# Patient Record
Sex: Female | Born: 1970 | Race: White | Hispanic: No | Marital: Married | Smoking: Never smoker
Health system: Southern US, Community
[De-identification: ages and names within clinical notes are randomized; demographics above are authoritative.]

## PROBLEM LIST (undated history)

## (undated) DIAGNOSIS — E785 Hyperlipidemia, unspecified: Secondary | ICD-10-CM

## (undated) DIAGNOSIS — E039 Hypothyroidism, unspecified: Secondary | ICD-10-CM

## (undated) DIAGNOSIS — G4733 Obstructive sleep apnea (adult) (pediatric): Secondary | ICD-10-CM

## (undated) DIAGNOSIS — R079 Chest pain, unspecified: Secondary | ICD-10-CM

## (undated) DIAGNOSIS — S161XXA Strain of muscle, fascia and tendon at neck level, initial encounter: Secondary | ICD-10-CM

## (undated) DIAGNOSIS — I10 Essential (primary) hypertension: Secondary | ICD-10-CM

## (undated) HISTORY — PX: BACK SURGERY: SHX140

## (undated) HISTORY — PX: OTHER SURGICAL HISTORY: SHX169

## (undated) HISTORY — DX: Essential (primary) hypertension: I10

## (undated) HISTORY — DX: Hyperlipidemia, unspecified: E78.5

## (undated) HISTORY — DX: Chest pain, unspecified: R07.9

## (undated) HISTORY — DX: Obstructive sleep apnea (adult) (pediatric): G47.33

## (undated) HISTORY — DX: Strain of muscle, fascia and tendon at neck level, initial encounter: S16.1XXA

## (undated) HISTORY — DX: Hypothyroidism, unspecified: E03.9

---

## 2021-07-04 ENCOUNTER — Encounter: Payer: Self-pay | Admitting: *Deleted

## 2021-07-05 ENCOUNTER — Ambulatory Visit (INDEPENDENT_AMBULATORY_CARE_PROVIDER_SITE_OTHER): Payer: BC Managed Care – PPO | Admitting: Internal Medicine

## 2021-07-05 ENCOUNTER — Other Ambulatory Visit: Payer: Self-pay

## 2021-07-05 ENCOUNTER — Encounter: Payer: Self-pay | Admitting: Internal Medicine

## 2021-07-05 VITALS — BP 118/70 | HR 67 | Ht 61.0 in | Wt 183.2 lb

## 2021-07-05 DIAGNOSIS — E786 Lipoprotein deficiency: Secondary | ICD-10-CM | POA: Diagnosis not present

## 2021-07-05 DIAGNOSIS — R079 Chest pain, unspecified: Secondary | ICD-10-CM | POA: Diagnosis not present

## 2021-07-05 DIAGNOSIS — E039 Hypothyroidism, unspecified: Secondary | ICD-10-CM | POA: Diagnosis not present

## 2021-07-05 MED ORDER — ROSUVASTATIN CALCIUM 40 MG PO TABS: 40.0000 mg | ORAL_TABLET | Freq: Every day | ORAL | 3 refills | Status: DC

## 2021-07-05 MED ORDER — ROSUVASTATIN CALCIUM 10 MG PO TABS: 10.0000 mg | ORAL_TABLET | Freq: Every day | ORAL | 3 refills | Status: DC

## 2021-07-05 MED ORDER — LISINOPRIL-HYDROCHLOROTHIAZIDE 10-12.5 MG PO TABS
ORAL_TABLET | ORAL | 3 refills | Status: DC
Start: 2021-07-05 — End: 2021-07-05

## 2021-07-05 NOTE — Patient Instructions (Signed)
Medication Instructions:  ?Your physician recommends that you continue on your current medications as directed. Please refer to the Current Medication list given to you today. ? ?*If you need a refill on your cardiac medications before your next appointment, please call your pharmacy* ? ? ?Lab Work: ?Your physician recommends that you return for lab work in: April  ? ?If you have labs (blood work) drawn today and your tests are completely normal, you will receive your results only by: ?MyChart Message (if you have MyChart) OR ?A paper copy in the mail ?If you have any lab test that is abnormal or we need to change your treatment, we will call you to review the results. ? ? ?Testing/Procedures: ?Calcium Score CT  ? ? ?Follow-Up: ?At [facility], you and your health needs are our priority.  As part of our continuing mission to provide you with exceptional heart care, we have created designated Provider Care Teams.  These Care Teams include your primary Cardiologist (physician) and Advanced Practice Providers (APPs -  Physician Assistants and Nurse Practitioners) who all work together to provide you with the care you need, when you need it. ? ?We recommend signing up for the patient portal called "MyChart".  Sign up information is provided on this After Visit Summary.  MyChart is used to connect with patients for Virtual Visits (Telemedicine).  Patients are able to view lab/test results, encounter notes, upcoming appointments, etc.  Non-urgent messages can be sent to your provider as well.   ?To learn more about what you can do with MyChart, go to [email or web address].   ? ?Your next appointment:   ?1 year(s) ? ?The format for your next appointment:   ?In Person ? ?Provider:   ?Dietrich Pates, MD  ? ? ?Other Instructions ?Thank you for choosing [facility]! ? ? ? ?

## 2021-07-05 NOTE — Progress Notes (Signed)
? ?Cardiology Office Note ? ? ?Date:  07/05/2021  ? ?ID:  Heidi Phelps, DOB 1971/03/12, MRN [id number] ? ?PCP:  Dion Saucier, PA-C  ?Cardiologist:   Dietrich Pates, MD  ? ? ? ?  ?History of Present Illness: ?Heidi Phelps is a 51 y.o. female with a history of hypertension and gestational diabetes.  [facility] January [facility]  At that time she complained of left-sided chest pain/soreness that started 5 days prior.  She had a viral infection (GI) in December had a lot of vomiting with that.  She started having chest pains after left side radiated to the rest of her chest hurt to move or bend forward also to take a deep breath got worse on the Friday Saturday before the visit.  She describes it actually as a pulling sensation that she had. ? ?Since that visit the episodes have nearly resolved. ? 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 ? ?The patient says she has been on medication for high blood pressure for about 15 years.  She is a mother of 2 and had gestational diabetes with both pregnancies.  Family history significant for CAD. ? ?Diet:   ?Breakfast:  Yogurt (Gr vanilla)  Fruit ?Lunch   Sandwich     ?Dinner:   Set designer and veggies ? ?Water  ? ? ?Current Meds  ?Medication Sig  ? cyclobenzaprine (FLEXERIL) 10 MG tablet Take 10 mg by mouth at bedtime.  ? levonorgestrel (MIRENA) 20 MCG/DAY IUD by Intrauterine route.  ? levothyroxine (SYNTHROID) 175 MCG tablet Take 175 mcg by mouth daily before breakfast.  ? meloxicam (MOBIC) 15 MG tablet Take 15 mg by mouth daily.  ? rosuvastatin (CRESTOR) 40 MG tablet Take 1 tablet (40 mg total) by mouth daily.  ? [DISCONTINUED] lisinopril-hydrochlorothiazide (ZESTORETIC) 10-12.5 MG tablet Take 1 tablet by mouth daily.  ? [DISCONTINUED] rosuvastatin (CRESTOR) 10 MG tablet Take 10 mg by mouth at bedtime.  ? ? ? ?Allergies:   Patient has no known allergies.  ? ?Past Medical History:  ?Diagnosis Date  ?  Chest pain   ? Hypertension   ? Hypothyroidism   ? Neck strain   ? ? ?Past Surgical History:  ?Procedure Laterality Date  ? BACK SURGERY    ? Gallbladder    ? ? ? ?Social History:  The patient  reports that she has never smoked. She has never used smokeless tobacco. She reports that she does not drink alcohol and does not use drugs.  ? ?Family History:  The patient's family history includes COPD in her mother; Diabetes in her mother; Emphysema in her mother; Hypertension in her father and mother; Kidney disease in her father; Thyroid disease in her brother.  ? ? ?ROS:  Please see the history of present illness. All other systems are reviewed and  Negative to the above problem except as noted.  ? ? ?PHYSICAL EXAM: ?VS:  BP 118/70   Pulse 67   Ht 5\' 1"  (1.549 m)   Wt 183 lb 3.2 oz (83.1 kg)   SpO2 97%   BMI 34.62 kg/m?   ?GEN: Obese 51 year old in no acute distress  ?HEENT: normal  ?Neck: no JVD, carotid bruits ?Cardiac: RRR; no murmurs, no lower extremity edema  ?Respiratory:  clear to auscultation bilaterally,  ?GI: soft, nontender, nondistended, + BS  No hepatomegaly  ?MS: no deformity Moving all extremities   ?  Skin: warm and dry, no rash ?Neuro:  Strength and sensation are intact ?Psych: euthymic mood, full affect ? ? ?EKG:  EKG is ordered today.  Normal sinus rhythm 67 bpm.  Nonspecific T wave changes ? ? ?Lipid Panel ?No results found for: CHOL, TRIG, HDL, CHOLHDL, VLDL, LDLCALC, LDLDIRECT ?  ? ?Wt Readings from Last 3 Encounters:  ?07/05/21 183 lb 3.2 oz (83.1 kg)  ?  ? ? ?ASSESSMENT AND PLAN: ?1.  Chest pain atypical I do not think it is cardiac in origin.  Appears more musculoskeletal. ? ?2 hypertension patient has been on meds for years blood pressures controlled ? ?3 hypothyroidism she is on Synthroid and her sugars are not regulated I would recommend a referral to Dr. Fransico Him confirm ? ?4 dyslipidemia.  Patient is now on Crestor.  Will check a lipomed in the future as LPa April B.   ? ?5.  Cardiac risk  factors  Patient has a family history of CAD.  She has a history of gestational diabetes.  She is hypertensive.  She is obese.  Reviewed her diet.  I would limit carbs, cut back on added sugar to less than 24 g/day.  I would also recommend referral again to Dr. Needed for lifestyle options.  She is very interested in this. ? ?She is also interested in a calcium score CT given risk factors to see if she has any plaquing already.  This is reasonable given that she is on a statin.  We will help define goals. ? ? ? ?Current medicines are reviewed at length with the patient today.  The patient does not have concerns regarding medicines. ? ?Signed, ?Dietrich Pates, MD  ?07/05/2021 9:24 AM    ?[facility] ?[address] [facility] [place], [place], [place]  [address] ?Phone: [phone]; Fax: [phone]  ? ? ?

## 2021-07-18 ENCOUNTER — Encounter: Payer: Self-pay | Admitting: Nurse Practitioner

## 2021-07-18 ENCOUNTER — Ambulatory Visit (INDEPENDENT_AMBULATORY_CARE_PROVIDER_SITE_OTHER): Payer: BC Managed Care – PPO | Admitting: Nurse Practitioner

## 2021-07-18 ENCOUNTER — Other Ambulatory Visit: Payer: Self-pay

## 2021-07-18 VITALS — BP 95/63 | HR 75 | Ht 61.0 in | Wt 179.6 lb

## 2021-07-18 DIAGNOSIS — E039 Hypothyroidism, unspecified: Secondary | ICD-10-CM | POA: Diagnosis not present

## 2021-07-18 MED ORDER — LEVOTHYROXINE SODIUM 125 MCG PO TABS: 125.0000 ug | ORAL_TABLET | Freq: Every day | ORAL | 3 refills | Status: DC

## 2021-07-18 NOTE — Progress Notes (Signed)
?                                   ?                                Endocrinology Consult Note  ?                                       07/18/2021, 2:42 PM ? ?Subjective:  ? ?Subjective   ? ?Heidi Phelps is a 51 y.o.-year-old female patient being seen in consultation for hypothyroidism referred by Dion Saucier, PA-C. ? ? ?Past Medical History:  ?Diagnosis Date  ? Chest pain   ? Hyperlipidemia   ? Hypertension   ? Hypothyroidism   ? Neck strain   ? ? ?Past Surgical History:  ?Procedure Laterality Date  ? BACK SURGERY    ? Gallbladder    ? ? ?Social History  ? ?Socioeconomic History  ? Marital status: Married  ?  Spouse name: Not on file  ? Number of children: Not on file  ? Years of education: Not on file  ? Highest education level: Not on file  ?Occupational History  ? Not on file  ?Tobacco Use  ? Smoking status: Never  ? Smokeless tobacco: Never  ?Vaping Use  ? Vaping Use: Never used  ?Substance and Sexual Activity  ? Alcohol use: Never  ? Drug use: Never  ? Sexual activity: Not on file  ?Other Topics Concern  ? Not on file  ?Social History Narrative  ? Not on file  ? ?Social Determinants of Health  ? ?Financial Resource Strain: Not on file  ?Food Insecurity: Not on file  ?Transportation Needs: Not on file  ?Physical Activity: Not on file  ?Stress: Not on file  ?Social Connections: Not on file  ? ? ?Family History  ?Problem Relation Age of Onset  ? Thyroid disease Mother   ? Hypertension Mother   ? Diabetes Mother   ? COPD Mother   ? Emphysema Mother   ? Hyperlipidemia Mother   ? Diabetes Father   ? Kidney disease Father   ? Hypertension Father   ? Thyroid disease Brother   ? ? ?Outpatient Encounter Medications as of 07/18/2021  ?Medication Sig  ? cyclobenzaprine (FLEXERIL) 10 MG tablet Take 10 mg by mouth at bedtime.  ? levonorgestrel (MIRENA) 20 MCG/DAY IUD by Intrauterine route.  ? levothyroxine (SYNTHROID) 125 MCG tablet Take 1 tablet (125 mcg total) by mouth daily.  ? lisinopril-hydrochlorothiazide  (ZESTORETIC) 20-12.5 MG tablet Take 1 tablet by mouth daily.  ? meloxicam (MOBIC) 15 MG tablet Take 15 mg by mouth daily.  ? rosuvastatin (CRESTOR) 10 MG tablet Take 1 tablet (10 mg total) by mouth daily.  ? [DISCONTINUED] levothyroxine (SYNTHROID) 150 MCG tablet Take 150 mcg by mouth every other day. Alternates every other day with Levothyroxine 175 mcg  ? [DISCONTINUED] levothyroxine (SYNTHROID) 175 MCG tablet Take 175 mcg by mouth daily before breakfast.  ? ?No facility-administered encounter medications on file as of 07/18/2021.  ? ? ?ALLERGIES: ?No Known Allergies ?VACCINATION STATUS: ? ?There is no immunization history on file for this patient. ? ? ?HPI  ? ?Heidi Phelps  is a patient with the above medical history. she  was diagnosed with hypothyroidism at approximate age of 21 years, which required subsequent initiation of thyroid hormone replacement. she was given various doses of Levothyroxine over the years, currently alternating between 150 and 175 micrograms. she reports compliance to this medication:  Taking it daily on empty stomach but she does take it with all of her other daily meds. ? ?I reviewed patient's thyroid tests: ? ?No results found for: TSH, FREET4  ? ?Pt describes: ?- weight gain ?- fatigue ?- constipation ?- dry skin ?- hair loss ?- joint pain ? ?Pt denies feeling nodules in neck, hoarseness, dysphagia/odynophagia, SOB with lying down. ? ?she does have extensive family history of thyroid disorders in her mother, father, siblings and grandparents (hypothyroidism).  No family history of thyroid cancer.  ?No history of radiation therapy to head or neck.  No recent use of iodine supplements.  Denies use of Biotin containing supplements. ? ?I reviewed her chart and she also has a history of psoriasis. ? ? ?ROS: ? ?Constitutional: +weight gain, +fatigue, no subjective hyperthermia, no subjective hypothermia, + brain fog ?Eyes: no blurry vision, no xerophthalmia ?ENT: no sore throat, no  nodules palpated in throat, no dysphagia/odynophagia, no hoarseness ?Cardiovascular: no chest pain, no SOB, no palpitations, intermittent leg swelling ?Respiratory: no cough, no SOB ?Gastrointestinal: no nausea/vomiting/diarrhea ?Musculoskeletal: + joint pain ?Skin: hx of psoriasis ?Neurological: no tremors, no numbness, no tingling, no dizziness ?Psychiatric: no depression, + anxiety ? ? ?Objective:  ? ?Objective   ? ? ?BP 95/63   Pulse 75   Ht 5\' 1"  (1.549 m)   Wt 179 lb 9.6 oz (81.5 kg)   SpO2 98%   BMI 33.94 kg/m?  ?Wt Readings from Last 3 Encounters:  ?07/18/21 179 lb 9.6 oz (81.5 kg)  ?07/05/21 183 lb 3.2 oz (83.1 kg)  ? ? ?BP Readings from Last 3 Encounters:  ?07/18/21 95/63  ?07/05/21 118/70  ?  ? ?Constitutional:  Body mass index is 33.94 kg/m?., not in acute distress, normal state of mind ?Eyes: PERRLA, EOMI, no exophthalmos ?ENT: moist mucous membranes, no thyromegaly, no cervical lymphadenopathy ?Cardiovascular: normal precordial activity, RRR, no murmur/rubs/gallops ?Respiratory:  adequate breathing efforts, no gross chest deformity, Clear to auscultation bilaterally ?Gastrointestinal: abdomen soft, non-tender, no distension, bowel sounds present ?Musculoskeletal: no gross deformities, strength intact in all four extremities ?Skin: moist, warm, no rashes ?Neurological: no tremor with outstretched hands, deep tendon reflexes normal in BLE. ? ? ?CMP ( most recent) ?CMP  ?No results found for: NA, K, CL, CO2, GLUCOSE, BUN, CREATININE, CALCIUM, PROT, ALBUMIN, AST, ALT, ALKPHOS, BILITOT, GFRNONAA, GFRAA ? ? ?Diabetic Labs (most recent): ?No results found for: HGBA1C ? ? Lipid Panel ( most recent) ?Lipid Panel  ?No results found for: CHOL, TRIG, HDL, CHOLHDL, VLDL, LDLCALC, LDLDIRECT, LABVLDL ?  ? ? ?No results found for: TSH, FREET4  ? ? ?Assessment & Plan:  ? ?ASSESSMENT / PLAN: ? ?1. Hypothyroidism-suspect r/t Hashimoto's ? ? Patient with long-standing hypothyroidism, on levothyroxine therapy. On  physical exam, patient  does not have gross goiter, thyroid nodules, or neck compression symptoms. ? ?She has been taking her Levothyroxine along with all her other meds, impacting consistent absorption, thus the reason she has fluctuated with control.  She is advised to lower her dose of Levothyroxine to 125 mcg po daily before breakfast (a more suitable dose for her weight).   Will have her get blood work today, including antibody testing given the suspicion for autoimmune thyroid dysfunction, and again prior to the  next appt in 6 weeks to assess response to new dose. ? ?- We discussed about correct intake of levothyroxine, at fasting, with water, separated by at least 30 minutes from breakfast, and separated by more than 4 hours from calcium, iron, multivitamins, acid reflux medications (PPIs). ?-Patient is made aware of the fact that thyroid hormone replacement is needed for life, dose to be adjusted by periodic monitoring of thyroid function tests. ? ? ?-Due to absence of clinical goiter, no need for thyroid ultrasound. ? ? ?- Time spent with the patient: 45 minutes, of which >50% was spent in obtaining information about her symptoms, reviewing her previous labs, evaluations, and treatments, counseling her about her hypothyroidism, and developing a plan to confirm the diagnosis and long term treatment as necessary. Please refer to "Patient Self Inventory" in the Media tab for reviewed elements of pertinent patient history. ? ?Heidi Phelps participated in the discussions, expressed understanding, and voiced agreement with the above plans.  All questions were answered to her satisfaction. she is encouraged to contact clinic should she have any questions or concerns prior to her return visit. ? ? ?FOLLOW UP PLAN: ? ?Return in about 6 weeks (around 08/29/2021) for Thyroid follow up; labs today and again prior to appt in 6 weeks.. ? ?Ronny Bacon, FNP-BC ?[facility] ?[address] ?[place], [place] [address] ?Phone: [phone] ?Fax: [phone] ? ?07/18/2021, 2:42 PM ? ?

## 2021-07-18 NOTE — Patient Instructions (Signed)

## 2021-07-20 ENCOUNTER — Telehealth: Payer: Self-pay

## 2021-07-20 LAB — T4, FREE: Free T4: 2 ng/dL — ABNORMAL HIGH (ref 0.82–1.77)

## 2021-07-20 LAB — THYROID PEROXIDASE ANTIBODY: Thyroperoxidase Ab SerPl-aCnc: <9 IU/mL (ref 0–34)

## 2021-07-20 LAB — THYROGLOBULIN ANTIBODY: Thyroglobulin Antibody: <1 IU/mL (ref 0.0–0.9)

## 2021-07-20 LAB — T3, FREE: T3, Free: 4.2 pg/mL (ref 2.0–4.4)

## 2021-07-20 LAB — TSH: TSH: 0.021 u[IU]/mL — ABNORMAL LOW (ref 0.450–4.500)

## 2021-07-20 NOTE — Telephone Encounter (Signed)
Called patient and left a detailed voice message for her to call back to discuss lab results. 

## 2021-07-20 NOTE — Telephone Encounter (Signed)
Called patient back and gave her the lab results. Patient verbalized an understanding. ?

## 2021-07-20 NOTE — Progress Notes (Signed)
Call patient and let her know that her labs were suggestive that she was on too much thyroid hormone.  We decreased her dose at the visit which will be much safer.  Her antibody testing was negative, ruling out autoimmune thyroid dysfunction as a cause which we will talk about more at the follow up visit.

## 2021-07-20 NOTE — Telephone Encounter (Signed)
Patient returning your call.

## 2021-07-30 ENCOUNTER — Other Ambulatory Visit (HOSPITAL_COMMUNITY): Payer: BC Managed Care – PPO

## 2021-08-07 ENCOUNTER — Ambulatory Visit (HOSPITAL_COMMUNITY): Payer: BC Managed Care – PPO

## 2021-08-22 ENCOUNTER — Telehealth: Payer: Self-pay | Admitting: Nurse Practitioner

## 2021-08-22 ENCOUNTER — Other Ambulatory Visit: Payer: Self-pay | Admitting: Nurse Practitioner

## 2021-08-22 DIAGNOSIS — E039 Hypothyroidism, unspecified: Secondary | ICD-10-CM

## 2021-08-22 NOTE — Telephone Encounter (Signed)
Yes, that is correct.  I just entered the labs

## 2021-08-22 NOTE — Telephone Encounter (Signed)
This patient has an appt next week. The appt note says she needed labs the day she was here and again in 6 weeks? Is this correct? If so, she needs orders placed ?

## 2021-08-23 ENCOUNTER — Ambulatory Visit (HOSPITAL_COMMUNITY): Payer: BC Managed Care – PPO

## 2021-08-23 LAB — T4, FREE: Free T4: 1.59 ng/dL (ref 0.82–1.77)

## 2021-08-23 LAB — TSH: TSH: 0.009 u[IU]/mL — ABNORMAL LOW (ref 0.450–4.500)

## 2021-08-24 LAB — NMR, LIPOPROFILE
Cholesterol, Total: 136 mg/dL (ref 100–199)
HDL Particle Number: 35.1 umol/L (ref >=30.5)
HDL-C: 49 mg/dL (ref >39)
LDL Particle Number: 820 nmol/L (ref <1000)
LDL Size: 21.4 nm (ref >20.5)
LDL-C (NIH Calc): 68 mg/dL (ref 0–99)
LP-IR Score: 57 — ABNORMAL HIGH (ref <=45)
Small LDL Particle Number: 405 nmol/L (ref <=527)
Triglycerides: 100 mg/dL (ref 0–149)

## 2021-08-24 LAB — APOLIPOPROTEIN B: Apolipoprotein B: 61 mg/dL (ref <90)

## 2021-08-24 LAB — LIPOPROTEIN A (LPA): Lipoprotein (a): 8.5 nmol/L (ref <75.0)

## 2021-08-27 ENCOUNTER — Telehealth: Payer: Self-pay | Admitting: Internal Medicine

## 2021-08-27 NOTE — Telephone Encounter (Signed)
Pt called re her lab results

## 2021-08-27 NOTE — Telephone Encounter (Signed)
Pt calling regarding test results. Please advise 

## 2021-08-29 ENCOUNTER — Encounter: Payer: Self-pay | Admitting: Nurse Practitioner

## 2021-08-29 ENCOUNTER — Ambulatory Visit (INDEPENDENT_AMBULATORY_CARE_PROVIDER_SITE_OTHER): Payer: BC Managed Care – PPO | Admitting: Nurse Practitioner

## 2021-08-29 VITALS — BP 100/61 | HR 75 | Ht 61.0 in | Wt 184.8 lb

## 2021-08-29 DIAGNOSIS — E039 Hypothyroidism, unspecified: Secondary | ICD-10-CM | POA: Diagnosis not present

## 2021-08-29 NOTE — Progress Notes (Signed)
?                                   ?                                Endocrinology Follow Up Note  ?                                       08/29/2021, 2:20 PM ? ?Subjective:  ? ?Subjective   ? ?Heidi Phelps is a 51 y.o.-year-old female patient being seen in follow up after being seen in consultation for hypothyroidism referred by Wanita Chamberlain, PA-C. ? ? ?Past Medical History:  ?Diagnosis Date  ? Chest pain   ? Hyperlipidemia   ? Hypertension   ? Hypothyroidism   ? Neck strain   ? ? ?Past Surgical History:  ?Procedure Laterality Date  ? BACK SURGERY    ? Gallbladder    ? ? ?Social History  ? ?Socioeconomic History  ? Marital status: Married  ?  Spouse name: Not on file  ? Number of children: Not on file  ? Years of education: Not on file  ? Highest education level: Not on file  ?Occupational History  ? Not on file  ?Tobacco Use  ? Smoking status: Never  ? Smokeless tobacco: Never  ?Vaping Use  ? Vaping Use: Never used  ?Substance and Sexual Activity  ? Alcohol use: Never  ? Drug use: Never  ? Sexual activity: Not on file  ?Other Topics Concern  ? Not on file  ?Social History Narrative  ? Not on file  ? ?Social Determinants of Health  ? ?Financial Resource Strain: Not on file  ?Food Insecurity: Not on file  ?Transportation Needs: Not on file  ?Physical Activity: Not on file  ?Stress: Not on file  ?Social Connections: Not on file  ? ? ?Family History  ?Problem Relation Age of Onset  ? Thyroid disease Mother   ? Hypertension Mother   ? Diabetes Mother   ? COPD Mother   ? Emphysema Mother   ? Hyperlipidemia Mother   ? Diabetes Father   ? Kidney disease Father   ? Hypertension Father   ? Thyroid disease Brother   ? ? ?Outpatient Encounter Medications as of 08/29/2021  ?Medication Sig  ? cyclobenzaprine (FLEXERIL) 10 MG tablet Take 10 mg by mouth at bedtime.  ? levonorgestrel (MIRENA) 20 MCG/DAY IUD by Intrauterine route.  ? levothyroxine (SYNTHROID) 125 MCG tablet Take 1 tablet (125 mcg total) by mouth daily.  ?  lisinopril-hydrochlorothiazide (ZESTORETIC) 20-12.5 MG tablet Take 1 tablet by mouth daily.  ? meloxicam (MOBIC) 15 MG tablet Take 15 mg by mouth daily.  ? omeprazole (PRILOSEC) 10 MG capsule Take 10 mg by mouth as needed.  ? rosuvastatin (CRESTOR) 10 MG tablet Take 1 tablet (10 mg total) by mouth daily.  ? ?No facility-administered encounter medications on file as of 08/29/2021.  ? ? ?ALLERGIES: ?No Known Allergies ?VACCINATION STATUS: ? ?There is no immunization history on file for this patient. ? ? ?HPI  ? ?Heidi Phelps  is a patient with the above medical history. she was diagnosed with hypothyroidism at approximate age of 70 years, which required subsequent initiation of thyroid hormone replacement. she  was given various doses of Levothyroxine over the years, currently alternating between 150 and 175 micrograms. she reports compliance to this medication:  Taking it daily on empty stomach but she does take it with all of her other daily meds. ? ?I reviewed patient's thyroid tests: ? ?Lab Results  ?Component Value Date  ? TSH 0.009 (L) 08/22/2021  ? TSH 0.021 (L) 07/18/2021  ? FREET4 1.59 08/22/2021  ? FREET4 2.00 (H) 07/18/2021  ?  ? ?Pt describes: ?- weight gain ?- fatigue ?- constipation ?- dry skin ?- hair loss ?- joint pain ? ?Pt denies feeling nodules in neck, hoarseness, dysphagia/odynophagia, SOB with lying down. ? ?she does have extensive family history of thyroid disorders in her mother, father, siblings and grandparents (hypothyroidism).  No family history of thyroid cancer.  ?No history of radiation therapy to head or neck.  No recent use of iodine supplements.  Denies use of Biotin containing supplements. ? ?I reviewed her chart and she also has a history of psoriasis. ? ? ?ROS: ? ?Constitutional: +weight gain, +fatigue, no subjective hyperthermia, no subjective hypothermia, + brain fog, mild headaches ?Eyes: no blurry vision, no xerophthalmia ?ENT: no sore throat, no nodules palpated in throat,  no dysphagia/odynophagia, no hoarseness ?Cardiovascular: no chest pain, no SOB, no palpitations, intermittent leg swelling ?Respiratory: no cough, no SOB ?Gastrointestinal: no nausea/vomiting/diarrhea ?Musculoskeletal: + joint pain- tingling sensation in left neck/shoulder ?Skin: hx of psoriasis ?Neurological: no tremors, no numbness, no tingling, no dizziness ?Psychiatric: no depression, no anxiety ? ? ?Objective:  ? ?Objective   ? ? ?BP 100/61   Pulse 75   Ht 5\' 1"  (1.549 m)   Wt 184 lb 12.8 oz (83.8 kg)   SpO2 97%   BMI 34.92 kg/m?  ?Wt Readings from Last 3 Encounters:  ?08/29/21 184 lb 12.8 oz (83.8 kg)  ?07/18/21 179 lb 9.6 oz (81.5 kg)  ?07/05/21 183 lb 3.2 oz (83.1 kg)  ? ? ?BP Readings from Last 3 Encounters:  ?08/29/21 100/61  ?07/18/21 95/63  ?07/05/21 118/70  ?  ? ? ?Physical Exam- Limited ? ?Constitutional:  Body mass index is 34.92 kg/m?. , not in acute distress, normal state of mind ?Eyes:  EOMI, no exophthalmos ?Neck: Supple ?Cardiovascular: RRR, no murmurs, rubs, or gallops, no edema ?Respiratory: Adequate breathing efforts, no crackles, rales, rhonchi, or wheezing ?Musculoskeletal: no gross deformities, strength intact in all four extremities, no gross restriction of joint movements ?Skin:  no rashes, no hyperemia ?Neurological: no tremor with outstretched hands ? ? ?CMP ( most recent) ?CMP  ?No results found for: NA, K, CL, CO2, GLUCOSE, BUN, CREATININE, CALCIUM, PROT, ALBUMIN, AST, ALT, ALKPHOS, BILITOT, GFRNONAA, GFRAA ? ? ?Diabetic Labs (most recent): ?No results found for: HGBA1C ? ? Lipid Panel ( most recent) ?Lipid Panel  ?No results found for: CHOL, TRIG, HDL, CHOLHDL, VLDL, LDLCALC, LDLDIRECT, LABVLDL ?  ? ? ?Lab Results  ?Component Value Date  ? TSH 0.009 (L) 08/22/2021  ? TSH 0.021 (L) 07/18/2021  ? FREET4 1.59 08/22/2021  ? FREET4 2.00 (H) 07/18/2021  ?  ? Latest Reference Range & Units 07/18/21 14:16 08/22/21 08:17  ?TSH 0.450 - 4.500 uIU/mL 0.021 (L) 0.009 (L)   ?Triiodothyronine,Free,Serum 2.0 - 4.4 pg/mL 4.2   ?T4,Free(Direct) 0.82 - 1.77 ng/dL 2.00 (H) 1.59  ?Thyroperoxidase Ab SerPl-aCnc 0 - 34 IU/mL <9   ?Thyroglobulin Antibody 0.0 - 0.9 IU/mL <1.0   ?(L): Data is abnormally low ?(H): Data is abnormally high ? ?Assessment & Plan:  ? ?  ASSESSMENT / PLAN: ? ?1. Hypothyroidism-acquired ? ? Patient with long-standing hypothyroidism, on levothyroxine therapy. On physical exam, patient  does not have gross goiter, thyroid nodules, or neck compression symptoms.   ? ?Her repeat thyroid function tests show continued suppression of TSH at 0.009 but improved Free T4 level of 1.59.  She has more symptoms of under-active thyroid at this time.  I recommend she stay at the Levothyroxine 125 mcg dose po daily before breakfast for now.  We may need to try branded Synthroid to control both lab values moving forward.  We discussed this today. ? ?- We discussed about correct intake of levothyroxine, at fasting, with water, separated by at least 30 minutes from breakfast, and separated by more than 4 hours from calcium, iron, multivitamins, acid reflux medications (PPIs). ?-Patient is made aware of the fact that thyroid hormone replacement is needed for life, dose to be adjusted by periodic monitoring of thyroid function tests. ? ? ?-Due to absence of clinical goiter, no need for thyroid ultrasound. ? ? ? ?I spent 20 minutes in the care of the patient today including review of labs from Thyroid Function, CMP, and other relevant labs ; imaging/biopsy records (current and previous including abstractions from other facilities); face-to-face time discussing  her lab results and symptoms, medications doses, her options of short and long term treatment based on the latest standards of care / guidelines;   and documenting the encounter. ? ?Heidi Phelps  participated in the discussions, expressed understanding, and voiced agreement with the above plans.  All questions were answered to her  satisfaction. she is encouraged to contact clinic should she have any questions or concerns prior to her return visit. ? ? ?FOLLOW UP PLAN: ? ?Return in about 2 months (around 10/29/2021) for Thyroid follow up, Previsit labs. ? ?Whitn

## 2021-08-29 NOTE — Patient Instructions (Signed)

## 2021-08-31 ENCOUNTER — Ambulatory Visit (HOSPITAL_COMMUNITY)
Admission: RE | Admit: 2021-08-31 | Discharge: 2021-08-31 | Disposition: A | Discharge status: Home / Self Care | Payer: BC Managed Care – PPO | Source: Ambulatory Visit | Attending: Internal Medicine | Admitting: Internal Medicine

## 2021-08-31 DIAGNOSIS — R079 Chest pain, unspecified: Secondary | ICD-10-CM

## 2021-09-12 ENCOUNTER — Ambulatory Visit: Payer: BC Managed Care – PPO | Admitting: Internal Medicine

## 2021-10-19 LAB — T4, FREE: Free T4: 1.77 ng/dL (ref 0.82–1.77)

## 2021-10-19 LAB — TSH: TSH: 0.015 u[IU]/mL — ABNORMAL LOW (ref 0.450–4.500)

## 2021-10-30 ENCOUNTER — Encounter: Payer: Self-pay | Admitting: Nurse Practitioner

## 2021-10-30 ENCOUNTER — Ambulatory Visit (INDEPENDENT_AMBULATORY_CARE_PROVIDER_SITE_OTHER): Payer: BC Managed Care – PPO | Admitting: Nurse Practitioner

## 2021-10-30 VITALS — BP 119/79 | HR 56 | Ht 61.0 in | Wt 183.0 lb

## 2021-10-30 DIAGNOSIS — E039 Hypothyroidism, unspecified: Secondary | ICD-10-CM | POA: Diagnosis not present

## 2022-02-27 LAB — T4, FREE: Free T4: 1.57 ng/dL (ref 0.82–1.77)

## 2022-02-27 LAB — TSH: TSH: 0.087 u[IU]/mL — ABNORMAL LOW (ref 0.450–4.500)

## 2022-03-04 ENCOUNTER — Encounter: Payer: Self-pay | Admitting: Nurse Practitioner

## 2022-03-04 ENCOUNTER — Ambulatory Visit (INDEPENDENT_AMBULATORY_CARE_PROVIDER_SITE_OTHER): Payer: BC Managed Care – PPO | Admitting: Nurse Practitioner

## 2022-03-04 VITALS — BP 110/70 | HR 68 | Ht 61.0 in | Wt 188.2 lb

## 2022-03-04 DIAGNOSIS — E039 Hypothyroidism, unspecified: Secondary | ICD-10-CM

## 2022-03-04 NOTE — Patient Instructions (Signed)

## 2022-03-04 NOTE — Progress Notes (Signed)
Endocrinology Follow Up Note                                         03/04/2022, 2:23 PM  Subjective:   Subjective    Heidi Phelps is a 51 y.o.-year-old female patient being seen in follow up after being seen in consultation for hypothyroidism referred by Wanita Chamberlain, PA-C.   Past Medical History:  Diagnosis Date   Chest pain    Hyperlipidemia    Hypertension    Hypothyroidism    Neck strain     Past Surgical History:  Procedure Laterality Date   BACK SURGERY     Gallbladder      Social History   Socioeconomic History   Marital status: Married    Spouse name: Not on file   Number of children: Not on file   Years of education: Not on file   Highest education level: Not on file  Occupational History   Not on file  Tobacco Use   Smoking status: Never   Smokeless tobacco: Never  Vaping Use   Vaping Use: Never used  Substance and Sexual Activity   Alcohol use: Never   Drug use: Never   Sexual activity: Not on file  Other Topics Concern   Not on file  Social History Narrative   Not on file   Social Determinants of Health   Financial Resource Strain: Not on file  Food Insecurity: Not on file  Transportation Needs: Not on file  Physical Activity: Not on file  Stress: Not on file  Social Connections: Not on file    Family History  Problem Relation Age of Onset   Thyroid disease Mother    Hypertension Mother    Diabetes Mother    COPD Mother    Emphysema Mother    Hyperlipidemia Mother    Diabetes Father    Kidney disease Father    Hypertension Father    Thyroid disease Brother     Outpatient Encounter Medications as of 03/04/2022  Medication Sig   cyclobenzaprine (FLEXERIL) 10 MG tablet Take 10 mg by mouth at bedtime.   levonorgestrel (MIRENA) 20 MCG/DAY IUD by Intrauterine route.   levothyroxine (SYNTHROID) 125 MCG tablet Take 1 tablet (125 mcg total) by mouth daily.    lisinopril-hydrochlorothiazide (ZESTORETIC) 20-12.5 MG tablet Take 1 tablet by mouth daily.   meloxicam (MOBIC) 15 MG tablet Take 15 mg by mouth daily.   omeprazole (PRILOSEC) 10 MG capsule Take 10 mg by mouth as needed.   rosuvastatin (CRESTOR) 10 MG tablet Take 1 tablet (10 mg total) by mouth daily.   No facility-administered encounter medications on file as of 03/04/2022.    ALLERGIES: No Known Allergies VACCINATION STATUS:  There is no immunization history on file for this patient.   HPI   Heidi Phelps  is a patient with the above medical history. she was diagnosed with hypothyroidism at approximate age of 5 years, which required subsequent initiation of thyroid hormone replacement. she  was given various doses of Levothyroxine over the years, currently on 125 micrograms. she reports compliance to this medication:  Taking it daily on empty stomach separate from other medications.  I reviewed patient's thyroid tests:  Lab Results  Component Value Date   TSH 0.087 (L) 02/26/2022   TSH 0.015 (L) 10/18/2021   TSH 0.009 (L) 08/22/2021   TSH 0.021 (L) 07/18/2021   FREET4 1.57 02/26/2022   FREET4 1.77 10/18/2021   FREET4 1.59 08/22/2021   FREET4 2.00 (H) 07/18/2021    Pt denies feeling nodules in neck, hoarseness, dysphagia/odynophagia, SOB with lying down.  she does have extensive family history of thyroid disorders in her mother, father, siblings and grandparents (hypothyroidism).  No family history of thyroid cancer.  No history of radiation therapy to head or neck.  No recent use of iodine supplements.  Denies use of Biotin containing supplements.  I reviewed her chart and she also has a history of psoriasis.   Review of systems  Constitutional: + steadily increasing body weight,  current Body mass index is 35.56 kg/m. , + fatigue, + daytime somnolence, no subjective hyperthermia, no subjective hypothermia Eyes: no blurry vision, no xerophthalmia ENT: no sore throat,  no nodules palpated in throat, no dysphagia/odynophagia, no hoarseness Cardiovascular: no chest pain, no shortness of breath, no palpitations, no leg swelling Respiratory: no cough, no shortness of breath Gastrointestinal: no nausea/vomiting/diarrhea Musculoskeletal: no muscle/joint aches Skin: no rashes, no hyperemia Neurological: no tremors, no numbness, no tingling, no dizziness Psychiatric: no depression, no anxiety   Objective:   Objective     BP 110/70 (BP Location: Left Arm, Patient Position: Sitting, Cuff Size: Large)   Pulse 68   Ht 5\' 1"  (1.549 m)   Wt 188 lb 3.2 oz (85.4 kg)   BMI 35.56 kg/m  Wt Readings from Last 3 Encounters:  03/04/22 188 lb 3.2 oz (85.4 kg)  10/30/21 183 lb (83 kg)  08/29/21 184 lb 12.8 oz (83.8 kg)    BP Readings from Last 3 Encounters:  03/04/22 110/70  10/30/21 119/79  08/29/21 100/61     Physical Exam- Limited  Constitutional:  Body mass index is 35.56 kg/m. , not in acute distress, normal state of mind Eyes:  EOMI, no exophthalmos Neck: Supple Cardiovascular: RRR, no murmurs, rubs, or gallops, no edema Respiratory: Adequate breathing efforts, no crackles, rales, rhonchi, or wheezing Musculoskeletal: no gross deformities, strength intact in all four extremities, no gross restriction of joint movements Skin:  no rashes, no hyperemia Neurological: no tremor with outstretched hands   CMP ( most recent) CMP  No results found for: "NA", "K", "CL", "CO2", "GLUCOSE", "BUN", "CREATININE", "CALCIUM", "PROT", "ALBUMIN", "AST", "ALT", "ALKPHOS", "BILITOT", "GFRNONAA", "GFRAA"   Diabetic Labs (most recent): No results found for: "HGBA1C", "MICROALBUR"   Lipid Panel ( most recent) Lipid Panel  No results found for: "CHOL", "TRIG", "HDL", "CHOLHDL", "VLDL", "LDLCALC", "LDLDIRECT", "LABVLDL"     Lab Results  Component Value Date   TSH 0.087 (L) 02/26/2022   TSH 0.015 (L) 10/18/2021   TSH 0.009 (L) 08/22/2021   TSH 0.021 (L)  07/18/2021   FREET4 1.57 02/26/2022   FREET4 1.77 10/18/2021   FREET4 1.59 08/22/2021   FREET4 2.00 (H) 07/18/2021     Latest Reference Range & Units 07/18/21 14:16 08/22/21 08:17 10/18/21 15:45 02/26/22 16:01  TSH 0.450 - 4.500 uIU/mL 0.021 (L) 0.009 (L) 0.015 (L) 0.087 (L)  Triiodothyronine,Free,Serum 2.0 - 4.4 pg/mL 4.2     T4,Free(Direct) 0.82 - 1.77  ng/dL 4.17 (H) 4.08 1.44 8.18  Thyroperoxidase Ab SerPl-aCnc 0 - 34 IU/mL <9     Thyroglobulin Antibody 0.0 - 0.9 IU/mL <1.0     (L): Data is abnormally low (H): Data is abnormally high  Assessment & Plan:   ASSESSMENT / PLAN:  1. Hypothyroidism-acquired   Patient with long-standing hypothyroidism, on levothyroxine therapy. On physical exam, patient does not have gross goiter, thyroid nodules, or neck compression symptoms.    Her repeat thyroid function tests are consistent with appropriate hormone replacement (TSH still suppressed but normal Free T4).  She denies any symptoms of over active thyroid.  She is advised to continue Levothyroxine 125 mcg po daily before breakfast.   - We discussed about correct intake of levothyroxine, at fasting, with water, separated by at least 30 minutes from breakfast, and separated by more than 4 hours from calcium, iron, multivitamins, acid reflux medications (PPIs). -Patient is made aware of the fact that thyroid hormone replacement is needed for life, dose to be adjusted by periodic monitoring of thyroid function tests.   -Due to absence of clinical goiter, no need for thyroid ultrasound.     I spent 37 minutes in the care of the patient today including review of labs from Thyroid Function, CMP, and other relevant labs ; imaging/biopsy records (current and previous including abstractions from other facilities); face-to-face time discussing  her lab results and symptoms, medications doses, her options of short and long term treatment based on the latest standards of care / guidelines;   and  documenting the encounter.  [name]  participated in the discussions, expressed understanding, and voiced agreement with the above plans.  All questions were answered to her satisfaction. she is encouraged to contact clinic should she have any questions or concerns prior to her return visit.   FOLLOW UP PLAN:  Return in about 4 months (around 07/04/2022) for Thyroid follow up, Previsit labs.  Ronny Bacon, [facility] [address] [place], [place] [address] Phone: [phone] Fax: [phone]  03/04/2022, 2:23 PM

## 2022-07-03 LAB — TSH: TSH: 3.89 u[IU]/mL (ref 0.450–4.500)

## 2022-07-03 LAB — T4, FREE: Free T4: 1.41 ng/dL (ref 0.82–1.77)

## 2022-07-08 NOTE — Patient Instructions (Signed)

## 2022-07-09 ENCOUNTER — Encounter: Payer: Self-pay | Admitting: Nurse Practitioner

## 2022-07-09 ENCOUNTER — Ambulatory Visit (INDEPENDENT_AMBULATORY_CARE_PROVIDER_SITE_OTHER): Payer: BC Managed Care – PPO | Admitting: Nurse Practitioner

## 2022-07-09 VITALS — BP 135/89 | HR 72 | Ht 61.0 in | Wt 191.6 lb

## 2022-07-09 DIAGNOSIS — E039 Hypothyroidism, unspecified: Secondary | ICD-10-CM

## 2022-07-09 MED ORDER — LEVOTHYROXINE SODIUM 137 MCG PO TABS: 137.0000 ug | ORAL_TABLET | Freq: Every day | ORAL | 1 refills | Status: DC

## 2022-07-09 NOTE — Progress Notes (Signed)
Endocrinology Follow Up Note                                         07/09/2022, 3:19 PM  Subjective:   Subjective    Heidi Phelps is a 52 y.o.-year-old female patient being seen in follow up after being seen in consultation for hypothyroidism referred by Wanita Chamberlain, PA-C.   Past Medical History:  Diagnosis Date   Chest pain    Hyperlipidemia    Hypertension    Hypothyroidism    Neck strain     Past Surgical History:  Procedure Laterality Date   BACK SURGERY     Gallbladder      Social History   Socioeconomic History   Marital status: Married    Spouse name: Not on file   Number of children: Not on file   Years of education: Not on file   Highest education level: Not on file  Occupational History   Not on file  Tobacco Use   Smoking status: Never   Smokeless tobacco: Never  Vaping Use   Vaping Use: Never used  Substance and Sexual Activity   Alcohol use: Never   Drug use: Never   Sexual activity: Not on file  Other Topics Concern   Not on file  Social History Narrative   Not on file   Social Determinants of Health   Financial Resource Strain: Not on file  Food Insecurity: Not on file  Transportation Needs: Not on file  Physical Activity: Not on file  Stress: Not on file  Social Connections: Not on file    Family History  Problem Relation Age of Onset   Thyroid disease Mother    Hypertension Mother    Diabetes Mother    COPD Mother    Emphysema Mother    Hyperlipidemia Mother    Diabetes Father    Kidney disease Father    Hypertension Father    Thyroid disease Brother     Outpatient Encounter Medications as of 07/09/2022  Medication Sig   cyclobenzaprine (FLEXERIL) 10 MG tablet Take 10 mg by mouth at bedtime.   levonorgestrel (MIRENA) 20 MCG/DAY IUD by Intrauterine route.   lisinopril-hydrochlorothiazide (ZESTORETIC) 20-12.5 MG tablet Take 1 tablet by mouth daily.    meloxicam (MOBIC) 15 MG tablet Take 15 mg by mouth daily.   omeprazole (PRILOSEC) 10 MG capsule Take 10 mg by mouth as needed.   rosuvastatin (CRESTOR) 10 MG tablet Take 1 tablet (10 mg total) by mouth daily.   [DISCONTINUED] levothyroxine (SYNTHROID) 125 MCG tablet Take 1 tablet (125 mcg total) by mouth daily.   levothyroxine (SYNTHROID) 137 MCG tablet Take 1 tablet (137 mcg total) by mouth daily before breakfast.   No facility-administered encounter medications on file as of 07/09/2022.    ALLERGIES: No Known Allergies VACCINATION STATUS:  There is no immunization history on file for this patient.   HPI   Heidi Phelps  is a patient with the above medical history. she  was diagnosed with hypothyroidism at approximate age of 82 years, which required subsequent initiation of thyroid hormone replacement. she was given various doses of Levothyroxine over the years, currently on 125 micrograms. she reports compliance to this medication:  Taking it daily on empty stomach separate from other medications.  I reviewed patient's thyroid tests:  Lab Results  Component Value Date   TSH 3.890 07/02/2022   TSH 0.087 (L) 02/26/2022   TSH 0.015 (L) 10/18/2021   TSH 0.009 (L) 08/22/2021   TSH 0.021 (L) 07/18/2021   FREET4 1.41 07/02/2022   FREET4 1.57 02/26/2022   FREET4 1.77 10/18/2021   FREET4 1.59 08/22/2021   FREET4 2.00 (H) 07/18/2021    Pt denies feeling nodules in neck, hoarseness, dysphagia/odynophagia, SOB with lying down.  she does have extensive family history of thyroid disorders in her mother, father, siblings and grandparents (hypothyroidism).  No family history of thyroid cancer.  No history of radiation therapy to head or neck.  No recent use of iodine supplements.  Denies use of Biotin containing supplements.  I reviewed her chart and she also has a history of psoriasis.   Review of systems  Constitutional: + steadily increasing body weight,  current Body mass index is  36.2 kg/m. , + fatigue, + daytime somnolence, no subjective hyperthermia, no subjective hypothermia Eyes: no blurry vision, no xerophthalmia ENT: no sore throat, no nodules palpated in throat, no dysphagia/odynophagia, no hoarseness Cardiovascular: no chest pain, no shortness of breath, no palpitations, no leg swelling Respiratory: no cough, no shortness of breath Gastrointestinal: no nausea/vomiting/diarrhea Musculoskeletal: no muscle/joint aches Skin: no rashes, no hyperemia Neurological: no tremors, no numbness, no tingling, no dizziness Psychiatric: no depression, no anxiety   Objective:   Objective     BP 135/89 (BP Location: Left Arm, Patient Position: Sitting, Cuff Size: Large)   Pulse 72   Ht '5\' 1"'$  (1.549 m)   Wt 191 lb 9.6 oz (86.9 kg)   BMI 36.20 kg/m  Wt Readings from Last 3 Encounters:  07/09/22 191 lb 9.6 oz (86.9 kg)  03/04/22 188 lb 3.2 oz (85.4 kg)  10/30/21 183 lb (83 kg)    BP Readings from Last 3 Encounters:  07/09/22 135/89  03/04/22 110/70  10/30/21 119/79     Physical Exam- Limited  Constitutional:  Body mass index is 36.2 kg/m. , not in acute distress, normal state of mind Eyes:  EOMI, no exophthalmos Musculoskeletal: no gross deformities, strength intact in all four extremities, no gross restriction of joint movements Skin:  no rashes, no hyperemia Neurological: no tremor with outstretched hands   CMP ( most recent) CMP  No results found for: "NA", "K", "CL", "CO2", "GLUCOSE", "BUN", "CREATININE", "CALCIUM", "PROT", "ALBUMIN", "AST", "ALT", "ALKPHOS", "BILITOT", "GFRNONAA", "GFRAA"   Diabetic Labs (most recent): No results found for: "HGBA1C", "MICROALBUR"   Lipid Panel ( most recent) Lipid Panel  No results found for: "CHOL", "TRIG", "HDL", "CHOLHDL", "VLDL", "LDLCALC", "LDLDIRECT", "LABVLDL"     Lab Results  Component Value Date   TSH 3.890 07/02/2022   TSH 0.087 (L) 02/26/2022   TSH 0.015 (L) 10/18/2021   TSH 0.009 (L)  08/22/2021   TSH 0.021 (L) 07/18/2021   FREET4 1.41 07/02/2022   FREET4 1.57 02/26/2022   FREET4 1.77 10/18/2021   FREET4 1.59 08/22/2021   FREET4 2.00 (H) 07/18/2021     Latest Reference Range & Units 08/22/21 08:17 10/18/21 15:45 02/26/22 16:01 07/02/22 08:27  TSH 0.450 - 4.500 uIU/mL 0.009 (L) 0.015 (L) 0.087 (  L) 3.890  T4,Free(Direct) 0.82 - 1.77 ng/dL 1.59 1.77 1.57 1.41  (L): Data is abnormally low  Assessment & Plan:   ASSESSMENT / PLAN:  1. Hypothyroidism-acquired  Patient with long-standing hypothyroidism, on levothyroxine therapy. On physical exam, patient does not have gross goiter, thyroid nodules, or neck compression symptoms.    Her repeat thyroid function tests are consistent with slight under-replacement.  She is advised to increase her Levothyroxine to 137 mcg po daily before breakfast.  - We discussed about correct intake of levothyroxine, at fasting, with water, separated by at least 30 minutes from breakfast, and separated by more than 4 hours from calcium, iron, multivitamins, acid reflux medications (PPIs). -Patient is made aware of the fact that thyroid hormone replacement is needed for life, dose to be adjusted by periodic monitoring of thyroid function tests.   -Due to absence of clinical goiter, no need for thyroid ultrasound.   I did give her information on [employer] lifestyle changes which can help with her overall symptoms. The following Lifestyle Medicine recommendations according to [facility]) were discussed and offered to patient and she agrees to start the journey:  A. Whole Foods, Plant-based plate comprising of fruits and vegetables, plant-based proteins, whole-grain carbohydrates was discussed in detail with the patient.   A list for source of those nutrients were also provided to the patient.  Patient will use only water or unsweetened tea for hydration. B.  The need to stay away from risky substances including  alcohol, smoking; obtaining 7 to 9 hours of restorative sleep, at least 150 minutes of moderate intensity exercise weekly, the importance of healthy social connections,  and stress reduction techniques were discussed. C.  A full color page of  Calorie density of various food groups per pound showing examples of each food groups was provided to the patient.   I spent  16  minutes in the care of the patient today including review of labs from Thyroid Function, CMP, and other relevant labs ; imaging/biopsy records (current and previous including abstractions from other facilities); face-to-face time discussing  her lab results and symptoms, medications doses, her options of short and long term treatment based on the latest standards of care / guidelines;   and documenting the encounter.  Lia Foyer  participated in the discussions, expressed understanding, and voiced agreement with the above plans.  All questions were answered to her satisfaction. she is encouraged to contact clinic should she have any questions or concerns prior to her return visit.   FOLLOW UP PLAN:  Return in about 3 months (around 10/09/2022) for Thyroid follow up, Previsit labs.  Rayetta Pigg, [facility] [address] [place], [place] [address] Phone: [phone] Fax: [phone]  07/09/2022, 3:19 PM

## 2022-10-03 LAB — TSH: TSH: 0.009 u[IU]/mL — ABNORMAL LOW (ref 0.450–4.500)

## 2022-10-03 LAB — T4, FREE: Free T4: 1.97 ng/dL — ABNORMAL HIGH (ref 0.82–1.77)

## 2022-10-08 NOTE — Patient Instructions (Signed)

## 2022-10-09 ENCOUNTER — Ambulatory Visit (INDEPENDENT_AMBULATORY_CARE_PROVIDER_SITE_OTHER): Payer: BC Managed Care – PPO | Admitting: Nurse Practitioner

## 2022-10-09 ENCOUNTER — Encounter: Payer: Self-pay | Admitting: Nurse Practitioner

## 2022-10-09 VITALS — BP 103/70 | HR 66 | Ht 61.0 in | Wt 170.2 lb

## 2022-10-09 DIAGNOSIS — E039 Hypothyroidism, unspecified: Secondary | ICD-10-CM

## 2022-10-09 MED ORDER — LEVOTHYROXINE SODIUM 125 MCG PO TABS
125.0000 ug | ORAL_TABLET | Freq: Every day | ORAL | 0 refills | Status: DC
Start: 2022-10-09 — End: 2023-01-14

## 2022-10-09 NOTE — Progress Notes (Signed)
Endocrinology Follow Up Note                                         10/09/2022, 4:42 PM  Subjective:   Subjective    Heidi Phelps is a 52 y.o.-year-old female patient being seen in follow up after being seen in consultation for hypothyroidism referred by Dion Saucier, PA-C.   Past Medical History:  Diagnosis Date   Chest pain    Hyperlipidemia    Hypertension    Hypothyroidism    Neck strain     Past Surgical History:  Procedure Laterality Date   BACK SURGERY     Gallbladder      Social History   Socioeconomic History   Marital status: Married    Spouse name: Not on file   Number of children: Not on file   Years of education: Not on file   Highest education level: Not on file  Occupational History   Not on file  Tobacco Use   Smoking status: Never   Smokeless tobacco: Never  Vaping Use   Vaping Use: Never used  Substance and Sexual Activity   Alcohol use: Never   Drug use: Never   Sexual activity: Not on file  Other Topics Concern   Not on file  Social History Narrative   Not on file   Social Determinants of Health   Financial Resource Strain: Not on file  Food Insecurity: Not on file  Transportation Needs: Not on file  Physical Activity: Not on file  Stress: Not on file  Social Connections: Not on file    Family History  Problem Relation Age of Onset   Thyroid disease Mother    Hypertension Mother    Diabetes Mother    COPD Mother    Emphysema Mother    Hyperlipidemia Mother    Diabetes Father    Kidney disease Father    Hypertension Father    Thyroid disease Brother     Outpatient Encounter Medications as of 10/09/2022  Medication Sig   levonorgestrel (MIRENA) 20 MCG/DAY IUD by Intrauterine route.   lisinopril-hydrochlorothiazide (ZESTORETIC) 20-12.5 MG tablet Take 1 tablet by mouth daily.   meloxicam (MOBIC) 15 MG tablet Take 15 mg by mouth daily.   omeprazole  (PRILOSEC) 10 MG capsule Take 10 mg by mouth as needed.   rosuvastatin (CRESTOR) 10 MG tablet Take 1 tablet (10 mg total) by mouth daily.   [DISCONTINUED] levothyroxine (SYNTHROID) 137 MCG tablet Take 1 tablet (137 mcg total) by mouth daily before breakfast.   cyclobenzaprine (FLEXERIL) 10 MG tablet Take 10 mg by mouth at bedtime. (Patient not taking: Reported on 10/09/2022)   levothyroxine (SYNTHROID) 125 MCG tablet Take 1 tablet (125 mcg total) by mouth daily before breakfast.   No facility-administered encounter medications on file as of 10/09/2022.    ALLERGIES: No Known Allergies VACCINATION STATUS:  There is no immunization history on file for this patient.   HPI   Heidi Phelps  is  a patient with the above medical history. she was diagnosed with hypothyroidism at approximate age of 21 years, which required subsequent initiation of thyroid hormone replacement. she was given various doses of Levothyroxine over the years, currently on 125 micrograms. she reports compliance to this medication:  Taking it daily on empty stomach separate from other medications.  I reviewed patient's thyroid tests:  Lab Results  Component Value Date   TSH 0.009 (L) 10/02/2022   TSH 3.890 07/02/2022   TSH 0.087 (L) 02/26/2022   TSH 0.015 (L) 10/18/2021   TSH 0.009 (L) 08/22/2021   TSH 0.021 (L) 07/18/2021   FREET4 1.97 (H) 10/02/2022   FREET4 1.41 07/02/2022   FREET4 1.57 02/26/2022   FREET4 1.77 10/18/2021   FREET4 1.59 08/22/2021   FREET4 2.00 (H) 07/18/2021    Pt denies feeling nodules in neck, hoarseness, dysphagia/odynophagia, SOB with lying down.  she does have extensive family history of thyroid disorders in her mother, father, siblings and grandparents (hypothyroidism).  No family history of thyroid cancer.  No history of radiation therapy to head or neck.  No recent use of iodine supplements.  Denies use of Biotin containing supplements.  I reviewed her chart and she also has a  history of psoriasis.   Review of systems  Constitutional: + steadily decreasing body weight (recently adopted healthier lifestyle due to prediabetes diagnosis),  current Body mass index is 32.16 kg/m. , + fatigue-improved, no subjective hyperthermia, no subjective hypothermia Eyes: no blurry vision, no xerophthalmia ENT: no sore throat, no nodules palpated in throat, no dysphagia/odynophagia, no hoarseness Cardiovascular: no chest pain, no shortness of breath, no palpitations, no leg swelling Respiratory: no cough, no shortness of breath Gastrointestinal: no nausea/vomiting/diarrhea Musculoskeletal: no muscle/joint aches Skin: no rashes, no hyperemia Neurological: no tremors, no numbness, no tingling, no dizziness Psychiatric: no depression, no anxiety   Objective:   Objective     BP 103/70 (BP Location: Left Arm, Patient Position: Sitting, Cuff Size: Large)   Pulse 66   Ht 5\' 1"  (1.549 m)   Wt 170 lb 3.2 oz (77.2 kg)   BMI 32.16 kg/m  Wt Readings from Last 3 Encounters:  10/09/22 170 lb 3.2 oz (77.2 kg)  07/09/22 191 lb 9.6 oz (86.9 kg)  03/04/22 188 lb 3.2 oz (85.4 kg)    BP Readings from Last 3 Encounters:  10/09/22 103/70  07/09/22 135/89  03/04/22 110/70     Physical Exam- Limited  Constitutional:  Body mass index is 32.16 kg/m. , not in acute distress, normal state of mind Eyes:  EOMI, no exophthalmos Musculoskeletal: no gross deformities, strength intact in all four extremities, no gross restriction of joint movements Skin:  no rashes, no hyperemia Neurological: no tremor with outstretched hands   CMP ( most recent) CMP  No results found for: "NA", "K", "CL", "CO2", "GLUCOSE", "BUN", "CREATININE", "CALCIUM", "PROT", "ALBUMIN", "AST", "ALT", "ALKPHOS", "BILITOT", "GFRNONAA", "GFRAA"   Diabetic Labs (most recent): No results found for: "HGBA1C", "MICROALBUR"   Lipid Panel ( most recent) Lipid Panel  No results found for: "CHOL", "TRIG", "HDL",  "CHOLHDL", "VLDL", "LDLCALC", "LDLDIRECT", "LABVLDL"     Lab Results  Component Value Date   TSH 0.009 (L) 10/02/2022   TSH 3.890 07/02/2022   TSH 0.087 (L) 02/26/2022   TSH 0.015 (L) 10/18/2021   TSH 0.009 (L) 08/22/2021   TSH 0.021 (L) 07/18/2021   FREET4 1.97 (H) 10/02/2022   FREET4 1.41 07/02/2022   FREET4 1.57 02/26/2022   FREET4 1.77 10/18/2021  FREET4 1.59 08/22/2021   FREET4 2.00 (H) 07/18/2021     Latest Reference Range & Units 08/22/21 08:17 10/18/21 15:45 02/26/22 16:01 07/02/22 08:27  TSH 0.450 - 4.500 uIU/mL 0.009 (L) 0.015 (L) 0.087 (L) 3.890  T4,Free(Direct) 0.82 - 1.77 ng/dL 1.61 0.96 0.45 4.09  (L): Data is abnormally low  Assessment & Plan:   ASSESSMENT / PLAN:  1. Hypothyroidism-acquired  Patient with long-standing hypothyroidism, on levothyroxine therapy. On physical exam, patient does not have gross goiter, thyroid nodules, or neck compression symptoms.    Her repeat thyroid function tests are consistent with slight over-replacement.  She is advised to decrease her Levothyroxine to 125 mcg po daily before breakfast.  - We discussed about correct intake of levothyroxine, at fasting, with water, separated by at least 30 minutes from breakfast, and separated by more than 4 hours from calcium, iron, multivitamins, acid reflux medications (PPIs). -Patient is made aware of the fact that thyroid hormone replacement is needed for life, dose to be adjusted by periodic monitoring of thyroid function tests.  2. Hypercalcemia Her PCP recently did labs to assess for diabetes and noticed high calcium levels.  PTH was also checked noted to be high at 78.  She does not know of any personal or family history of parathyroid problems, adrenal problems, kidney stones, osteoporosis.  She does not take a calcium supplement, does not eat calcium rick foods in excess.  Will check 24 hr urine specimen to rule out FHH, CMP, Calcium and PTH level, PTH-rp, Magnesium, Phosphorous to  assess for hyperparathyroidism.      I did give her information on WFPB lifestyle changes which can help with her overall symptoms. The following Lifestyle Medicine recommendations according to American College of Lifestyle Medicine [facility]) were discussed and offered to patient and she agrees to start the journey:  A. Whole Foods, Plant-based plate comprising of fruits and vegetables, plant-based proteins, whole-grain carbohydrates was discussed in detail with the patient.   A list for source of those nutrients were also provided to the patient.  Patient will use only water or unsweetened tea for hydration. B.  The need to stay away from risky substances including alcohol, smoking; obtaining 7 to 9 hours of restorative sleep, at least 150 minutes of moderate intensity exercise weekly, the importance of healthy social connections,  and stress reduction techniques were discussed. C.  A full color page of  Calorie density of various food groups per pound showing examples of each food groups was provided to the patient.   I spent  33  minutes in the care of the patient today including review of labs from Thyroid Function, CMP, and other relevant labs ; imaging/biopsy records (current and previous including abstractions from other facilities); face-to-face time discussing  her lab results and symptoms, medications doses, her options of short and long term treatment based on the latest standards of care / guidelines;   and documenting the encounter.  [name]  participated in the discussions, expressed understanding, and voiced agreement with the above plans.  All questions were answered to her satisfaction. she is encouraged to contact clinic should she have any questions or concerns prior to her return visit.   FOLLOW UP PLAN:  Return in about 3 months (around 01/09/2023) for Thyroid follow up, Previsit labs.  Ronny Bacon, [facility] [address] [place], [place] [address] Phone: [phone] Fax: [phone]  10/09/2022, 4:42 PM

## 2022-10-10 LAB — COMPREHENSIVE METABOLIC PANEL
ALT: 26 IU/L (ref 0–32)
AST: 16 IU/L (ref 0–40)
Albumin/Globulin Ratio: 1.9 (ref 1.2–2.2)
Albumin: 4.4 g/dL (ref 3.8–4.9)
Alkaline Phosphatase: 59 IU/L (ref 44–121)
BUN/Creatinine Ratio: 35 — ABNORMAL HIGH (ref 9–23)
BUN: 31 mg/dL — ABNORMAL HIGH (ref 6–24)
Bilirubin Total: 0.3 mg/dL (ref 0.0–1.2)
CO2: 27 mmol/L (ref 20–29)
Calcium: 10.1 mg/dL (ref 8.7–10.2)
Chloride: 101 mmol/L (ref 96–106)
Creatinine, Ser: 0.88 mg/dL (ref 0.57–1.00)
Globulin, Total: 2.3 g/dL (ref 1.5–4.5)
Glucose: 97 mg/dL (ref 70–99)
Potassium: 4.8 mmol/L (ref 3.5–5.2)
Sodium: 140 mmol/L (ref 134–144)
Total Protein: 6.7 g/dL (ref 6.0–8.5)
eGFR: 79 mL/min/{1.73_m2} (ref >59)

## 2022-10-10 LAB — PTH, INTACT AND CALCIUM: PTH: 56 pg/mL (ref 15–65)

## 2022-10-10 LAB — MAGNESIUM: Magnesium: 1.8 mg/dL (ref 1.6–2.3)

## 2022-10-10 LAB — VITAMIN D 25 HYDROXY (VIT D DEFICIENCY, FRACTURES): Vit D, 25-Hydroxy: 39.6 ng/mL (ref 30.0–100.0)

## 2022-10-10 LAB — PHOSPHORUS: Phosphorus: 4.1 mg/dL (ref 3.0–4.3)

## 2022-10-12 LAB — CALCIUM, URINE, 24 HOUR

## 2022-10-13 LAB — CREATININE, URINE, 24 HOUR
Creatinine, 24H Ur: 1175 mg/24 hr (ref 800–1800)
Creatinine, Urine: 69.1 mg/dL

## 2022-10-13 LAB — CALCIUM, URINE, 24 HOUR: Calcium, Urine: 7.5 mg/dL

## 2022-10-14 ENCOUNTER — Telehealth: Payer: Self-pay | Admitting: *Deleted

## 2022-10-14 NOTE — Progress Notes (Signed)
Please call the patient and let her know that all of the labs she had done for the high calcium levels have returned normal.  This means she does not have a parathyroid problem, nor does she have high calcium levels any longer.

## 2022-10-14 NOTE — Telephone Encounter (Signed)
-----   Message from Dani Gobble, NP sent at 10/14/2022  7:09 AM EDT ----- Please call the patient and let her know that all of the labs she had done for the high calcium levels have returned normal.  This means she does not have a parathyroid problem, nor does she have high calcium levels any longer.

## 2022-10-14 NOTE — Telephone Encounter (Signed)
Patient was called and given her results. 

## 2022-11-25 ENCOUNTER — Encounter: Payer: Self-pay | Admitting: Internal Medicine

## 2022-11-25 ENCOUNTER — Ambulatory Visit: Payer: BC Managed Care – PPO | Attending: Internal Medicine | Admitting: Internal Medicine

## 2022-11-25 ENCOUNTER — Other Ambulatory Visit (HOSPITAL_COMMUNITY)
Admission: RE | Admit: 2022-11-25 | Discharge: 2022-11-25 | Disposition: A | Discharge status: Home / Self Care | Payer: BC Managed Care – PPO | Source: Ambulatory Visit | Attending: Internal Medicine | Admitting: Internal Medicine

## 2022-11-25 VITALS — BP 124/74 | HR 55 | Ht 61.0 in | Wt 163.6 lb

## 2022-11-25 DIAGNOSIS — Z136 Encounter for screening for cardiovascular disorders: Secondary | ICD-10-CM

## 2022-11-25 DIAGNOSIS — E786 Lipoprotein deficiency: Secondary | ICD-10-CM | POA: Insufficient documentation

## 2022-11-25 DIAGNOSIS — R0683 Snoring: Secondary | ICD-10-CM | POA: Diagnosis not present

## 2022-11-25 DIAGNOSIS — Z131 Encounter for screening for diabetes mellitus: Secondary | ICD-10-CM | POA: Diagnosis not present

## 2022-11-25 LAB — HEMOGLOBIN A1C
Hgb A1c MFr Bld: 5.2 % (ref 4.8–5.6)
Mean Plasma Glucose: 102.54 mg/dL

## 2022-11-25 LAB — COMPREHENSIVE METABOLIC PANEL
ALT: 20 U/L (ref 0–44)
AST: 19 U/L (ref 15–41)
Albumin: 4.3 g/dL (ref 3.5–5.0)
Alkaline Phosphatase: 55 U/L (ref 38–126)
Anion gap: 10 (ref 5–15)
BUN: 44 mg/dL — ABNORMAL HIGH (ref 6–20)
CO2: 25 mmol/L (ref 22–32)
Calcium: 9.7 mg/dL (ref 8.9–10.3)
Chloride: 101 mmol/L (ref 98–111)
Creatinine, Ser: 1.2 mg/dL — ABNORMAL HIGH (ref 0.44–1.00)
GFR, Estimated: 54 mL/min — ABNORMAL LOW (ref >60)
Glucose, Bld: 100 mg/dL — ABNORMAL HIGH (ref 70–99)
Potassium: 4.6 mmol/L (ref 3.5–5.1)
Sodium: 136 mmol/L (ref 135–145)
Total Bilirubin: 0.4 mg/dL (ref 0.3–1.2)
Total Protein: 7.8 g/dL (ref 6.5–8.1)

## 2022-11-25 NOTE — Progress Notes (Signed)
Sleep Apnea Evaluation  [facility]  Today's Date: 11/25/2022   Patient Name: EVEE LISKA        DOB: 28-Apr-1971       Height:  5\' 1"  (1.549 m)     Weight: 163 lb 9.6 oz (74.2 kg)  BMI: Body mass index is 30.91 kg/m.    Referring Provider:  Dr. Tenny Craw    STOP-BANG RISK ASSESSMENT         If STOP-BANG Score ?3 OR two clinical symptoms - patient qualifies for WatchPAT (CPT 95800)      Sleep study ordered due to two (2) of the following clinical symptoms/diagnoses:  Excessive daytime sleepiness G47.10  Gastroesophageal reflux K21.9  Nocturia R35.1  Morning Headaches G44.221  Difficulty concentrating R41.840  Memory problems or poor judgment G31.84  Personality changes or irritability R45.4  Loud snoring R06.83  Depression F32.9  Unrefreshed by sleep G47.8  Impotence N52.9  History of high blood pressure R03.0  Insomnia G47.00  Sleep Disordered Breathing or Sleep Apnea ICD G47.33

## 2022-11-25 NOTE — Patient Instructions (Signed)
Medication Instructions:  Your physician recommends that you continue on your current medications as directed. Please refer to the Current Medication list given to you today.  *If you need a refill on your cardiac medications before your next appointment, please call your pharmacy*   Lab Work: Your physician recommends that you return for lab work in: Today   If you have labs (blood work) drawn today and your tests are completely normal, you will receive your results only by: MyChart Message (if you have MyChart) OR A paper copy in the mail If you have any lab test that is abnormal or we need to change your treatment, we will call you to review the results.   Testing/Procedures: WatchPAT?  Is a FDA cleared portable home sleep study test that uses a watch and 3 points of contact to monitor 7 different channels, including your heart rate, oxygen saturations, body position, snoring, and chest motion.  The study is easy to use from the comfort of your own home and accurately detect sleep apnea.  Before bed, you attach the chest sensor, attached the sleep apnea bracelet to your nondominant hand, and attach the finger probe.  After the study, the raw data is downloaded from the watch and scored for apnea events.   For more information: [email or web address]  Patient Testing Instructions:  Do not put battery into the device until bedtime when you are ready to begin the test. Please call the support number if you need assistance after following the instructions below: 24 hour support line- [phone] or ITAMAR support at [phone] (option 2)  Download the IntelWatchPAT One" app through the google play store or App Store  Be sure to turn on or enable access to bluetooth in settlings on your smartphone/ device  Make sure no other bluetooth devices are on and within the vicinity of your smartphone/ device and WatchPAT watch during testing.  Make sure to leave your smart  phone/ device plugged in and charging all night.  When ready for bed:  Follow the instructions step by step in the WatchPAT One App to activate the testing device. For additional instructions, including video instruction, visit the WatchPAT One video on Youtube. You can search for WatchPat One within Youtube (video is 4 minutes and 18 seconds) or enter: https://youtube/watch?v=BCce_vbiwxE Please note: You will be prompted to enter a Pin to connect via bluetooth when starting the test. The PIN will be assigned to you when you receive the test.  The device is disposable, but it recommended that you retain the device until you receive a call letting you know the study has been received and the results have been interpreted.  We will let you know if the study did not transmit to [place] properly after the test is completed. You do not need to call us to confirm the receipt of the test.  Please complete the test within 48 hours of receiving PIN.   Frequently Asked Questions:  What is Watch Dennie Bible one?  A single use fully disposable home sleep apnea testing device and will not need to be returned after completion.  What are the requirements to use WatchPAT one?  The be able to have a successful watchpat one sleep study, you should have your Watch pat one device, your smart phone, watch pat one app, your PIN number and Internet access What type of phone do I need?  You should have a smart phone that uses Android 5.1 and above or any Iphone with IOS 10  and above How can I download the WatchPAT one app?  Based on your device type search for WatchPAT one app either in google play for android devices or APP store for Iphone's Where will I get my PIN for the study?  Your PIN will be provided by your physician's office. It is used for authentication and if you lose/forget your PIN, please reach out to your providers office.  I do not have Internet at home. Can I do WatchPAT one study?  WatchPAT One needs Internet  connection throughout the night to be able to transmit the sleep data. You can use your home/local internet or your cellular's data package. However, it is always recommended to use home/local Internet. It is estimated that between 20MB-30MB will be used with each study.However, the application will be looking for space in the phone to start the study.  What happens if I lose internet or bluetooth connection?  During the internet disconnection, your phone will not be able to transmit the sleep data. All the data, will be stored in your phone. As soon as the internet connection is back on, the phone will being sending the sleep data. During the bluetooth disconnection, WatchPAT one will not be able to to send the sleep data to your phone. Data will be kept in the [facility] one until two devices have bluetooth connection back on. As soon as the connection is back on, WatchPAT one will send the sleep data to the phone.  How long do I need to wear the WatchPAT one?  After you start the study, you should wear the device at least 6 hours.  How far should I keep my phone from the device?  During the night, your phone should be within 15 feet.  What happens if I leave the room for restroom or other reasons?  Leaving the room for any reason will not cause any problem. As soon as your get back to the room, both devices will reconnect and will continue to send the sleep data. Can I use my phone during the sleep study?  Yes, you can use your phone as usual during the study. But it is recommended to put your watchpat one on when you are ready to go to bed.  How will I get my study results?  A soon as you completed your study, your sleep data will be sent to the provider. They will then share the results with you when they are ready.      Follow-Up: At [facility], you and your health needs are our priority.  As part of our continuing mission to provide you with exceptional heart care, we have  created designated Provider Care Teams.  These Care Teams include your primary Cardiologist (physician) and Advanced Practice Providers (APPs -  Physician Assistants and Nurse Practitioners) who all work together to provide you with the care you need, when you need it.  We recommend signing up for the patient portal called "MyChart".  Sign up information is provided on this After Visit Summary.  MyChart is used to connect with patients for Virtual Visits (Telemedicine).  Patients are able to view lab/test results, encounter notes, upcoming appointments, etc.  Non-urgent messages can be sent to your provider as well.   To learn more about what you can do with MyChart, go to [email or web address].    Your next appointment:   1 year(s)  Provider:   You may see Dietrich Pates, MD or one of the following Advanced  Practice Providers on your designated Care Team:   Randall An, PA-C  Jacolyn Reedy, [place]     Other Instructions Thank you for choosing [facility]!

## 2022-11-25 NOTE — Progress Notes (Signed)
Cardiology Office Note   Date:  11/25/2022   ID:  Heidi, Phelps [birth year], MRN [id number]  PCP:  Dion Saucier, PA-C  Cardiologist:   Dietrich Pates, MD   [name]    History of Present Illness: Heidi Phelps is a 52 y.o. female with a history of HTN, gestational DM and CP   I saw her in 2023   Spells atypical  With risk factores she was placed on statin for HL  She also underwent a Ca score CT  This showed mild hepatic statosis.  Elevated L hemidiaphragm  Ca score was 0  Since seen the pt says that she has started observing a Mediterranean diet   She has lost 27 lbs   Glucose cravings gone She denies CP  Breathing is OK  No dizziess   Walking regularly   Was going to gym  Now working out at home    Current Meds  Medication Sig   levonorgestrel (MIRENA) 20 MCG/DAY IUD by Intrauterine route.   levothyroxine (SYNTHROID) 125 MCG tablet Take 1 tablet (125 mcg total) by mouth daily before breakfast.   lisinopril-hydrochlorothiazide (ZESTORETIC) 20-12.5 MG tablet Take 1 tablet by mouth daily.   meloxicam (MOBIC) 15 MG tablet Take 15 mg by mouth daily.   omeprazole (PRILOSEC) 10 MG capsule Take 10 mg by mouth as needed.   rosuvastatin (CRESTOR) 10 MG tablet Take 1 tablet (10 mg total) by mouth daily.     Allergies:   Patient has no known allergies.   Past Medical History:  Diagnosis Date   Chest pain    Hyperlipidemia    Hypertension    Hypothyroidism    Neck strain     Past Surgical History:  Procedure Laterality Date   BACK SURGERY     Gallbladder       Social History:  The patient  reports that she has never smoked. She has never used smokeless tobacco. She reports that she does not drink alcohol and does not use drugs.   Family History:  The patient's family history includes COPD in her mother; Diabetes in her father and mother; Emphysema in her mother; Hyperlipidemia in her mother; Hypertension in her father and mother; Kidney  disease in her father; Thyroid disease in her brother and mother.    ROS:  Please see the history of present illness. All other systems are reviewed and  Negative to the above problem except as noted.    PHYSICAL EXAM: VS:  BP 124/74 (BP Location: Left Arm, Patient Position: Sitting, Cuff Size: Normal)   Pulse (!) 55   Ht 5\' 1"  (1.549 m)   Wt 163 lb 9.6 oz (74.2 kg)   SpO2 98%   BMI 30.91 kg/m   GEN: Obese 51 year old in NAD HEENT: normal  Neck: no JVD, carotid bruit Cardiac: RRR; no murmurs, no LE  edema  Respiratory:  clear to auscultation  GI: soft, nontender,No hepatomegaly   EKG:  EKG is ordered today.  Sinus bradycardia  55 bpm    Lipid Panel No results found for: "CHOL", "TRIG", "HDL", "CHOLHDL", "VLDL", "LDLCALC", "LDLDIRECT"    Wt Readings from Last 3 Encounters:  11/25/22 163 lb 9.6 oz (74.2 kg)  10/09/22 170 lb 3.2 oz (77.2 kg)  07/09/22 191 lb 9.6 oz (86.9 kg)      ASSESSMENT AND PLAN: 1.  Hx CP  Pt denies      2  HTN  Good controll  Keep on current regimen  3  HL   Will repeat lipids   Last in 2023 when her diet was different   May be able to back off   4  Thyroid  Keep on synthroid    5.  Snoring   Pt with excess snoring  possible apnea   Will order a sleep study  6  Cardiac risk factors  Patient has done great with lifestyle modifications (diet, exercise)  Encouraged her to continue  As lose weight may be able to back off meds further   Follow up 1 year for progress  Blood work today:  CMET, lipomed Signed, Dietrich Pates, MD  11/25/2022 1:05 PM

## 2022-11-26 LAB — NMR, LIPOPROFILE
Cholesterol, Total: 122 mg/dL (ref 100–199)
HDL Cholesterol by NMR: 52 mg/dL (ref >39)
HDL Particle Number: 33.1 umol/L (ref >=30.5)
LDL Particle Number: 747 nmol/L (ref <1000)
LDL Size: 19.7 nm — ABNORMAL LOW (ref >20.5)
LDL-C (NIH Calc): 54 mg/dL (ref 0–99)
LP-IR Score: 37 (ref <=45)
Small LDL Particle Number: 536 nmol/L — ABNORMAL HIGH (ref <=527)
Triglycerides by NMR: 78 mg/dL (ref 0–149)

## 2022-11-27 ENCOUNTER — Telehealth: Payer: Self-pay | Admitting: *Deleted

## 2022-11-27 NOTE — Telephone Encounter (Signed)
Prior Authorization for ITAMAR sent to [employer] via web portal. Tracking Number .  do not require Pre-Authorization 

## 2022-11-28 ENCOUNTER — Encounter: Payer: Self-pay | Admitting: Cardiology

## 2022-11-28 DIAGNOSIS — G4733 Obstructive sleep apnea (adult) (pediatric): Secondary | ICD-10-CM | POA: Diagnosis not present

## 2022-11-28 NOTE — Telephone Encounter (Signed)
**Note De-Identified Conn Trombetta Obfuscation** I called the pt and provided her with the Pin #:"[id number]" so she can proceed with her Watch Pat-HST. He verbalized understanding and is aware to call us if she has any questions of concerns.  She thanked me for my call.

## 2022-11-29 ENCOUNTER — Ambulatory Visit: Payer: BC Managed Care – PPO | Attending: Internal Medicine

## 2022-11-29 DIAGNOSIS — R0683 Snoring: Secondary | ICD-10-CM

## 2022-11-29 NOTE — Procedures (Signed)
Patient Information Study Date: 11/28/2022 Patient Name: Heidi Phelps Patient ID: [id number] Birth Date: May 12, 1970 Age: 52 Gender: Female BMI: 30.0 (W=163 lb, H=5' 2'') Referring Physician: Dietrich Pates, MD  TEST DESCRIPTION:  Home sleep apnea testing was completed using the WatchPat, a Type 1 device, utilizing peripheral arterial tonometry (PAT), chest movement, actigraphy, pulse oximetry, pulse rate, body position and snore.  AHI was calculated with apnea and hypopnea using valid sleep time as the denominator. RDI includes apneas, hypopneas, and RERAs.  The data acquired and the scoring of sleep and all associated events were performed in accordance with the recommended standards and specifications as outlined in the AASM Manual for the Scoring of Sleep and Associated Events 2.2.0 (2015).  FINDINGS:  1.  Moderate Obstructive Sleep Apnea with AHI 15.2/hr.   2.  No Central Sleep Apnea with pAHIc 1.6/hr.  3.  Oxygen desaturations as low as 87%.  4.  Severe snoring was present. O2 sats were < 88% for 0.3 min.  5.  Total sleep time was 6 hrs and 14 min.  6.  36.1% of total sleep time was spent in REM sleep.   7.  Normal sleep onset latency at 16 min  8.  Shortened REM sleep onset latency at 44 min.   9.  Total awakenings were 4.  10. Arrhythmia detection:  None  DIAGNOSIS:   Moderate Obstructive Sleep Apnea (G47.33)  RECOMMENDATIONS:   1.  Clinical correlation of these findings is necessary.  The decision to treat obstructive sleep apnea (OSA) is usually based on the presence of apnea symptoms or the presence of associated medical conditions such as Hypertension, Congestive Heart Failure, Atrial Fibrillation or Obesity.  The most common symptoms of OSA are snoring, gasping for breath while sleeping, daytime sleepiness and fatigue.   2.  Initiating apnea therapy is recommended given the presence of symptoms and/or associated conditions. Recommend proceeding with one of the  following:     a.  Auto-CPAP therapy with a pressure range of 5-20cm H2O.     b.  An oral appliance (OA) that can be obtained from certain dentists with expertise in sleep medicine.  These are primarily of use in non-obese patients with mild and moderate disease.     c.  An ENT consultation which may be useful to look for specific causes of obstruction and possible treatment options.     d.  If patient is intolerant to PAP therapy, consider referral to ENT for evaluation for hypoglossal nerve stimulator.   3.  Close follow-up is necessary to ensure success with CPAP or oral appliance therapy for maximum benefit.  4.  A follow-up oximetry study on CPAP is recommended to assess the adequacy of therapy and determine the need for supplemental oxygen or the potential need for Bi-level therapy.  An arterial blood gas to determine the adequacy of baseline ventilation and oxygenation should also be considered.  5.  Healthy sleep recommendations include:  adequate nightly sleep (normal 7-9 hrs/night), avoidance of caffeine after noon and alcohol near bedtime, and maintaining a sleep environment that is cool, dark and quiet.  6.  Weight loss for overweight patients is recommended.  Even modest amounts of weight loss can significantly improve the severity of sleep apnea.  7.  Snoring recommendations include:  weight loss where appropriate, side sleeping, and avoidance of alcohol before bed.  8.  Operation of motor vehicle should not be performed when sleepy.  Signature: Armanda Magic, MD; [facility] Diplomat, American Board  of Sleep Medicine Electronically Signed: 11/29/2022 2:11:06 PM

## 2022-12-02 ENCOUNTER — Telehealth: Payer: Self-pay

## 2022-12-02 DIAGNOSIS — E786 Lipoprotein deficiency: Secondary | ICD-10-CM

## 2022-12-02 DIAGNOSIS — Z79899 Other long term (current) drug therapy: Secondary | ICD-10-CM

## 2022-12-02 MED ORDER — ROSUVASTATIN CALCIUM 5 MG PO TABS: 5.0000 mg | ORAL_TABLET | Freq: Every day | ORAL | 3 refills | Status: DC

## 2022-12-02 MED ORDER — LISINOPRIL 20 MG PO TABS: 20.0000 mg | ORAL_TABLET | Freq: Every day | ORAL | 3 refills | Status: DC

## 2022-12-02 NOTE — Telephone Encounter (Signed)
Patient returned call. She will decrease crestor to 5 mg every day, stop zestoretic, start lisinopril 20 mg and repeat bmet and lipomed in November. She will also stay hydrated.

## 2022-12-02 NOTE — Telephone Encounter (Signed)
-----   Message from [place] sent at 11/26/2022  9:03 PM EDT ----- Lipids are excellent   I would back down on Crestor to 5 mg     At some point may remove but with FHx keep for now    Follow lipomed in December    Cr 1.22   Stay hydrated    BP was very good   Could change lisinopril to 20 mg   Stop the hydrochlorothiazide portion Follow BP  Lipomed and BMET in November

## 2022-12-02 NOTE — Telephone Encounter (Signed)
Left message to return call 

## 2022-12-19 ENCOUNTER — Telehealth: Payer: Self-pay

## 2022-12-19 DIAGNOSIS — G4733 Obstructive sleep apnea (adult) (pediatric): Secondary | ICD-10-CM

## 2022-12-19 NOTE — Telephone Encounter (Signed)
Notified patient of sleep study results and recommendations. Patient agrees to CPAP treatment. All questions (if any) were answered. Patient verbalized understanding. New order sent to [facility] today 12/19/22.

## 2022-12-19 NOTE — Telephone Encounter (Signed)
 Left VM, per DPR, with callback number for sleep study results and recommendations.

## 2022-12-19 NOTE — Telephone Encounter (Signed)
-----   Message from Armanda Magic sent at 11/29/2022  2:13 PM EDT ----- Please let patient know that they have sleep apnea.  Recommend therapeutic CPAP titration for treatment of patient's sleep disordered breathing.  If unable to perform an in lab titration then initiate ResMed auto CPAP from 4 to 15cm H2O with heated humidity and mask of choice and overnight pulse ox on CPAP.

## 2023-01-14 ENCOUNTER — Encounter: Payer: Self-pay | Admitting: Nurse Practitioner

## 2023-01-14 ENCOUNTER — Ambulatory Visit (INDEPENDENT_AMBULATORY_CARE_PROVIDER_SITE_OTHER): Payer: BC Managed Care – PPO | Admitting: Nurse Practitioner

## 2023-01-14 VITALS — BP 117/77 | HR 63 | Ht 61.0 in | Wt 161.4 lb

## 2023-01-14 DIAGNOSIS — E039 Hypothyroidism, unspecified: Secondary | ICD-10-CM | POA: Diagnosis not present

## 2023-01-14 MED ORDER — LEVOTHYROXINE SODIUM 112 MCG PO TABS
112.0000 ug | ORAL_TABLET | Freq: Every day | ORAL | 3 refills | Status: DC
Start: 2023-01-14 — End: 2023-07-30

## 2023-01-14 NOTE — Patient Instructions (Signed)

## 2023-01-14 NOTE — Progress Notes (Signed)
Endocrinology Follow Up Note                                         01/14/2023, 3:18 PM  Subjective:   Subjective    Heidi Phelps is a 52 y.o.-year-old female patient being seen in follow up after being seen in consultation for hypothyroidism referred by Dion Saucier, PA-C.   Past Medical History:  Diagnosis Date   Chest pain    Hyperlipidemia    Hypertension    Hypothyroidism    Neck strain     Past Surgical History:  Procedure Laterality Date   BACK SURGERY     Gallbladder      Social History   Socioeconomic History   Marital status: Married    Spouse name: Not on file   Number of children: Not on file   Years of education: Not on file   Highest education level: Not on file  Occupational History   Not on file  Tobacco Use   Smoking status: Never   Smokeless tobacco: Never  Vaping Use   Vaping status: Never Used  Substance and Sexual Activity   Alcohol use: Never   Drug use: Never   Sexual activity: Not on file  Other Topics Concern   Not on file  Social History Narrative   Not on file   Social Determinants of Health   Financial Resource Strain: Not on file  Food Insecurity: No Food Insecurity (07/05/2022)   Received from [facility], [facility]   Hunger Vital Sign    Worried About Running Out of Food in the Last Year: Never true    Ran Out of Food in the Last Year: Never true  Transportation Needs: Not on file  Physical Activity: Inactive (05/24/2021)   Received from [facility], [facility]   Exercise Vital Sign    Days of Exercise per Week: 0 days    Minutes of Exercise per Session: 0 min  Stress: Stress Concern Present (05/24/2021)   Received from [facility], [facility] - Occupational Stress Questionnaire    Feeling of Stress : Rather much  Social Connections: Not on file    Family History  Problem  Relation Age of Onset   Thyroid disease Mother    Hypertension Mother    Diabetes Mother    COPD Mother    Emphysema Mother    Hyperlipidemia Mother    Diabetes Father    Kidney disease Father    Hypertension Father    Thyroid disease Brother     Outpatient Encounter Medications as of 01/14/2023  Medication Sig   levonorgestrel (MIRENA) 20 MCG/DAY IUD by Intrauterine route.   lisinopril (ZESTRIL) 20 MG tablet Take 1 tablet (20 mg total) by mouth daily.   meloxicam (MOBIC) 15 MG tablet Take 15 mg by mouth daily.   omeprazole (PRILOSEC) 10 MG capsule Take 10 mg by mouth as needed.  rosuvastatin (CRESTOR) 5 MG tablet Take 1 tablet (5 mg total) by mouth daily.   [DISCONTINUED] levothyroxine (SYNTHROID) 125 MCG tablet Take 1 tablet (125 mcg total) by mouth daily before breakfast.   levothyroxine (SYNTHROID) 112 MCG tablet Take 1 tablet (112 mcg total) by mouth daily before breakfast.   [DISCONTINUED] cyclobenzaprine (FLEXERIL) 10 MG tablet Take 10 mg by mouth at bedtime. (Patient not taking: Reported on 11/25/2022)   No facility-administered encounter medications on file as of 01/14/2023.    ALLERGIES: No Known Allergies VACCINATION STATUS:  There is no immunization history on file for this patient.   HPI   Heidi Phelps  is a patient with the above medical history. she was diagnosed with hypothyroidism at approximate age of 21 years, which required subsequent initiation of thyroid hormone replacement. she was given various doses of Levothyroxine over the years, currently on 125 micrograms. she reports compliance to this medication:  Taking it daily on empty stomach separate from other medications.  I reviewed patient's thyroid tests:  Lab Results  Component Value Date   TSH 0.043 (L) 01/08/2023   TSH 0.009 (L) 10/02/2022   TSH 3.890 07/02/2022   TSH 0.087 (L) 02/26/2022   TSH 0.015 (L) 10/18/2021   TSH 0.009 (L) 08/22/2021   TSH 0.021 (L) 07/18/2021   FREET4 1.79 (H)  01/08/2023   FREET4 1.97 (H) 10/02/2022   FREET4 1.41 07/02/2022   FREET4 1.57 02/26/2022   FREET4 1.77 10/18/2021   FREET4 1.59 08/22/2021   FREET4 2.00 (H) 07/18/2021    Pt denies feeling nodules in neck, hoarseness, dysphagia/odynophagia, SOB with lying down.  she does have extensive family history of thyroid disorders in her mother, father, siblings and grandparents (hypothyroidism).  No family history of thyroid cancer.  No history of radiation therapy to head or neck.  No recent use of iodine supplements.  Denies use of Biotin containing supplements.  I reviewed her chart and she also has a history of psoriasis.   Review of systems  Constitutional: + steadily decreasing body weight (recently adopted healthier lifestyle due to prediabetes diagnosis),  current Body mass index is 30.5 kg/m. , + fatigue-improved, no subjective hyperthermia, no subjective hypothermia Eyes: no blurry vision, no xerophthalmia ENT: no sore throat, no nodules palpated in throat, no dysphagia/odynophagia, no hoarseness Cardiovascular: no chest pain, no shortness of breath, no palpitations, no leg swelling Respiratory: no cough, no shortness of breath Gastrointestinal: no nausea/vomiting/diarrhea Musculoskeletal: no muscle/joint aches Skin: no rashes, no hyperemia, + hair loss Neurological: no tremors, no numbness, no tingling, no dizziness Psychiatric: no depression, no anxiety   Objective:   Objective     BP 117/77 (BP Location: Left Arm, Patient Position: Sitting, Cuff Size: Large)   Pulse 63   Ht 5\' 1"  (1.549 m)   Wt 161 lb 6.4 oz (73.2 kg)   BMI 30.50 kg/m  Wt Readings from Last 3 Encounters:  01/14/23 161 lb 6.4 oz (73.2 kg)  11/25/22 163 lb 9.6 oz (74.2 kg)  10/09/22 170 lb 3.2 oz (77.2 kg)    BP Readings from Last 3 Encounters:  01/14/23 117/77  11/25/22 124/74  10/09/22 103/70      Physical Exam- Limited  Constitutional:  Body mass index is 30.5 kg/m. , not in acute  distress, normal state of mind Eyes:  EOMI, no exophthalmos Musculoskeletal: no gross deformities, strength intact in all four extremities, no gross restriction of joint movements Skin:  no rashes, no hyperemia Neurological: no tremor with outstretched  hands   CMP ( most recent) CMP     Component Value Date/Time   NA 136 11/25/2022 1409   NA 140 10/09/2022 1530   K 4.6 11/25/2022 1409   CL 101 11/25/2022 1409   CO2 25 11/25/2022 1409   GLUCOSE 100 (H) 11/25/2022 1409   BUN 44 (H) 11/25/2022 1409   BUN 31 (H) 10/09/2022 1530   CREATININE 1.20 (H) 11/25/2022 1409   CALCIUM 9.7 11/25/2022 1409   PROT 7.8 11/25/2022 1409   PROT 6.7 10/09/2022 1530   ALBUMIN 4.3 11/25/2022 1409   ALBUMIN 4.4 10/09/2022 1530   AST 19 11/25/2022 1409   ALT 20 11/25/2022 1409   ALKPHOS 55 11/25/2022 1409   BILITOT 0.4 11/25/2022 1409   BILITOT 0.3 10/09/2022 1530   GFRNONAA 54 (L) 11/25/2022 1409     Diabetic Labs (most recent): Lab Results  Component Value Date   HGBA1C 5.2 11/25/2022     Lipid Panel ( most recent) Lipid Panel     Component Value Date/Time   TRIG 78 11/25/2022 1409   HDL 52 11/25/2022 1409       Lab Results  Component Value Date   TSH 0.043 (L) 01/08/2023   TSH 0.009 (L) 10/02/2022   TSH 3.890 07/02/2022   TSH 0.087 (L) 02/26/2022   TSH 0.015 (L) 10/18/2021   TSH 0.009 (L) 08/22/2021   TSH 0.021 (L) 07/18/2021   FREET4 1.79 (H) 01/08/2023   FREET4 1.97 (H) 10/02/2022   FREET4 1.41 07/02/2022   FREET4 1.57 02/26/2022   FREET4 1.77 10/18/2021   FREET4 1.59 08/22/2021   FREET4 2.00 (H) 07/18/2021     Latest Reference Range & Units 08/22/21 08:17 10/18/21 15:45 02/26/22 16:01 07/02/22 08:27 10/02/22 09:30 01/08/23 08:21  TSH 0.450 - 4.500 uIU/mL 0.009 (L) 0.015 (L) 0.087 (L) 3.890 0.009 (L) 0.043 (L)  T4,Free(Direct) 0.82 - 1.77 ng/dL 8.84 1.66 0.63 0.16 0.10 (H) 1.79 (H)  (L): Data is abnormally low (H): Data is abnormally high  Assessment & Plan:    ASSESSMENT / PLAN:  1. Hypothyroidism-acquired  Patient with long-standing hypothyroidism, on levothyroxine therapy. On physical exam, patient does not have gross goiter, thyroid nodules, or neck compression symptoms.    Her repeat thyroid function tests are consistent with slight over-replacement.  She is advised to decrease her Levothyroxine to 112 mcg po daily before breakfast.  This is likely due to her recent weight loss.   - We discussed about correct intake of levothyroxine, at fasting, with water, separated by at least 30 minutes from breakfast, and separated by more than 4 hours from calcium, iron, multivitamins, acid reflux medications (PPIs). -Patient is made aware of the fact that thyroid hormone replacement is needed for life, dose to be adjusted by periodic monitoring of thyroid function tests.  2. Hypercalcemia-resolved Her PCP recently did labs to assess for diabetes and noticed high calcium levels.  PTH was also checked noted to be high at 78.  She does not know of any personal or family history of parathyroid problems, adrenal problems, kidney stones, osteoporosis.  She does not take a calcium supplement, does not eat calcium rick foods in excess.  We did check 24 hr urine specimen to rule out FHH, CMP, Calcium and PTH level, PTH-rp, Magnesium, Phosphorous to assess for hyperparathyroidism, all of which came back normal.  No further evaluation needed at this time.    I did give her information on WFPB lifestyle changes which can help with her overall symptoms.  The following Lifestyle Medicine recommendations according to American College of Lifestyle Medicine [facility] [place]) were discussed and offered to patient and she agrees to start the journey:  A. Whole Foods, Plant-based plate comprising of fruits and vegetables, plant-based proteins, whole-grain carbohydrates was discussed in detail with the patient.   A list for source of those nutrients were also provided to the patient.   Patient will use only water or unsweetened tea for hydration. B.  The need to stay away from risky substances including alcohol, smoking; obtaining 7 to 9 hours of restorative sleep, at least 150 minutes of moderate intensity exercise weekly, the importance of healthy social connections,  and stress reduction techniques were discussed. C.  A full color page of  Calorie density of various food groups per pound showing examples of each food groups was provided to the patient.  -She also asked about several different supplements today, we went over safe supplements to take with her thyroid condition.     I spent  33  minutes in the care of the patient today including review of labs from Thyroid Function, CMP, and other relevant labs ; imaging/biopsy records (current and previous including abstractions from other facilities); face-to-face time discussing  her lab results and symptoms, medications doses, her options of short and long term treatment based on the latest standards of care / guidelines;   and documenting the encounter.  [name]  participated in the discussions, expressed understanding, and voiced agreement with the above plans.  All questions were answered to her satisfaction. she is encouraged to contact clinic should she have any questions or concerns prior to her return visit.   FOLLOW UP PLAN:  Return in about 3 months (around 04/15/2023) for Thyroid follow up, Previsit labs.  Ronny Bacon, [facility] [address] [place], [place] [address] Phone: [phone] Fax: [phone]  01/14/2023, 3:18 PM

## 2023-01-15 ENCOUNTER — Other Ambulatory Visit: Payer: Self-pay | Admitting: Internal Medicine

## 2023-03-10 ENCOUNTER — Telehealth: Payer: Self-pay | Admitting: Cardiology

## 2023-03-10 NOTE — Telephone Encounter (Signed)
Patient stated she completed her sleep study on 8/15 and is following up on getting her sleep equipment.

## 2023-03-17 IMAGING — CT CT CARDIAC CORONARY ARTERY CALCIUM SCORE
2 of 3 series · 8 of 20 positions shown, 10 images · non-contrast
Comparison: None

Addendum:
CLINICAL DATA: Cardiovascular disease risk stratification

Chest pain/anginal equiv, high CAD risk, treadmill candidate
EXAM:
CT Coronary Calcium Score
TECHNIQUE: A gated, non-contrast computed tomography scan of the heart was
performed using 3mm slice thickness. Axial images were analyzed on a
dedicated workstation. Calcium scoring of the coronary arteries was
performed using the Agatston method.

[Series 2: cascseq 3.0 qr36 70% · axial · 0.65mm/px · z∈[+1297,+1336]mm · 2 of 39 slices shown]
[im 13/39  vessel]
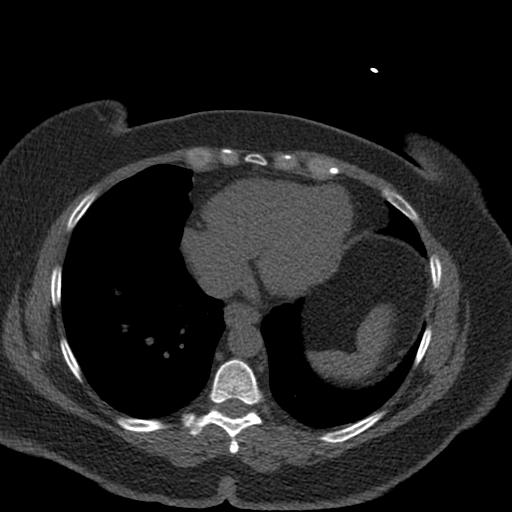
[im 26/39  vessel]
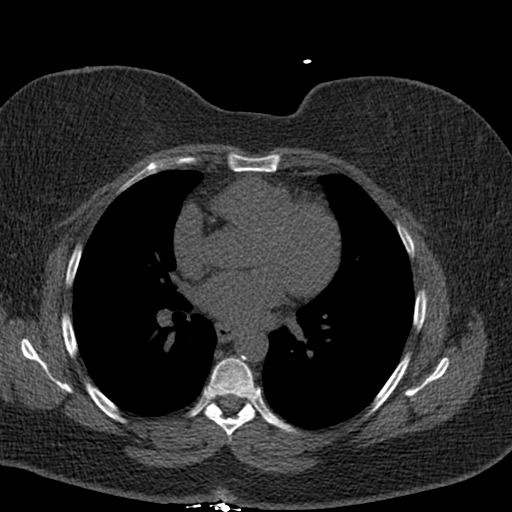

[Series 4: ax lung · axial · 0.95mm/px · z∈[+1276,+1358]mm · 6 of 59 slices shown, 8 images]
[im 9/59  vessel]
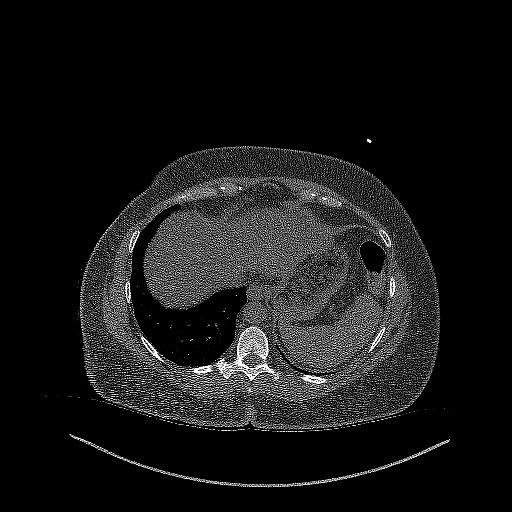
[im 9/59  lung]
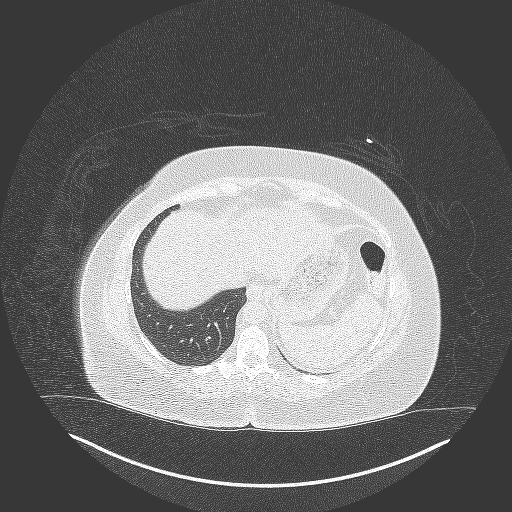
[im 17/59  vessel]
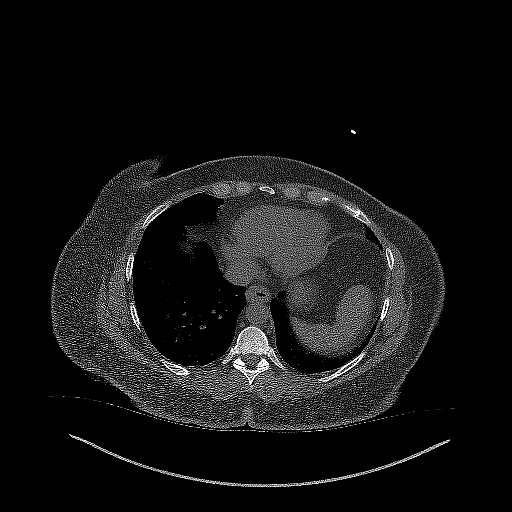
[im 25/59  vessel]
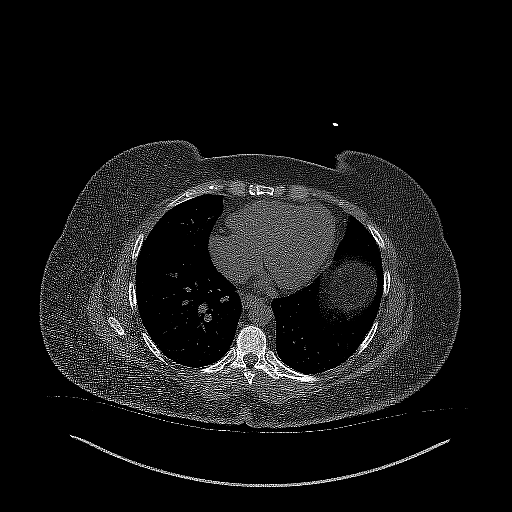
[im 34/59  vessel]
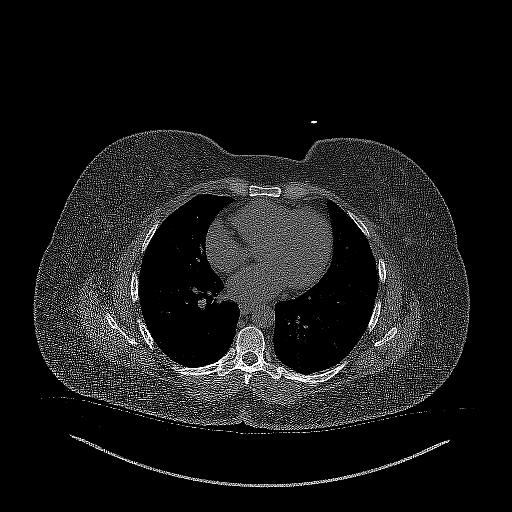
[im 42/59  vessel]
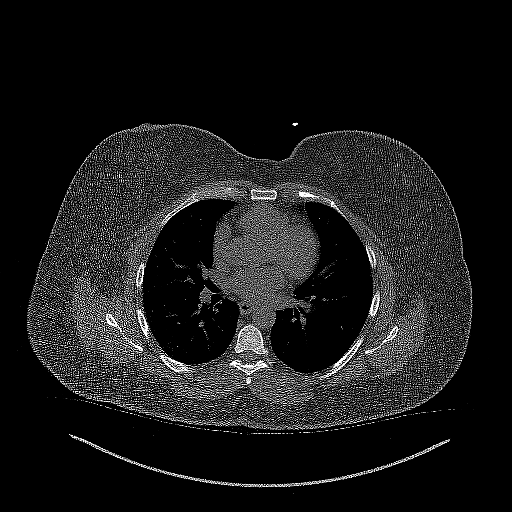
[im 42/59  lung]
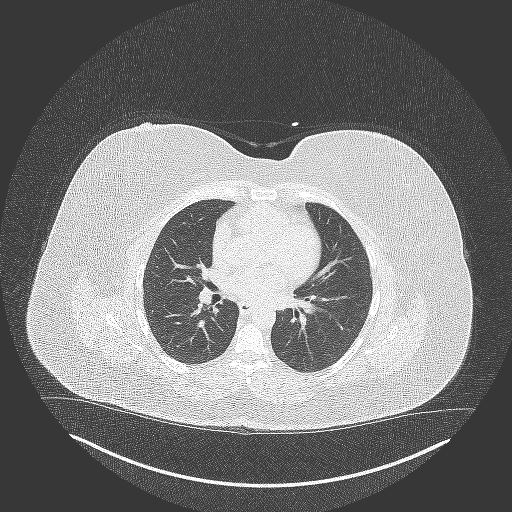
[im 50/59  vessel]
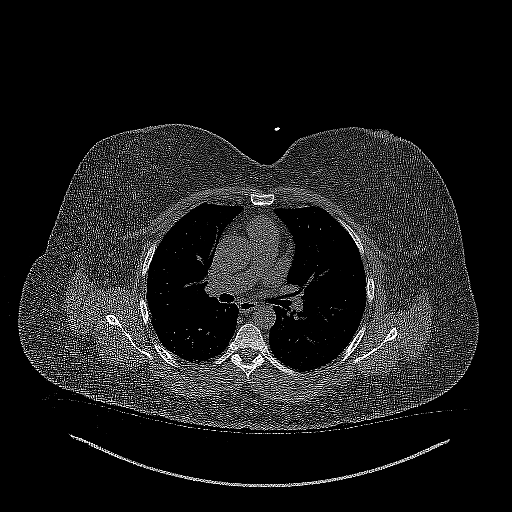

[8 of 20 positions shown; findings below may reference images not displayed]

FINDINGS: Coronary Calcium Score:

Left main: 0

Left anterior descending artery: 0

Left circumflex artery: 0

Right coronary artery: 0, small focus in proximal RCA that is below
the detection threshold of this scan.

Total: 0

Pericardium: Normal.

Ascending Aorta: Normal caliber. Ascending aorta measures
approximately 34mm at the mid ascending aorta measured in an axial
plane.

Non-cardiac: See separate report from [REDACTED].
IMPRESSION: Coronary calcium score of 0.



If CAC=0, it is reasonable to withhold statin therapy and reassess
in 5 to 10 years, as long as higher risk conditions are absent
(diabetes mellitus, family history of premature CHD in first degree
relatives (males <55 years; females <65 years), cigarette smoking,
or LDL >=190 mg/dL).

If CAC is 1 to 99, it is reasonable to initiate statin therapy for
patients >=55 years of age.

If CAC is >=100 or >=75th percentile, it is reasonable to initiate
statin therapy at any age.

Cardiology referral should be considered for patients with CAC
scores >=400 or >=75th percentile.

*3543 AHA/ACC/AACVPR/AAPA/ABC/MADU/CHAMIKA/ANTANIA/Megaptche/AMAZIGH/SUKAZI/BURDAK
Guideline on the Management of Blood Cholesterol: A Report of the
American College of Cardiology/American Heart Association Task Force
on Clinical Practice Guidelines. J Am Coll Cardiol.
2577;73(24):5809-5695.

ADDENDUM:
OVER-READ INTERPRETATION  CT CHEST

The following report is an over-read performed by radiologist Dr.
over-read does not include interpretation of cardiac or coronary
anatomy or pathology. The coronary artery calcium score
interpretation by the cardiologist is attached. Imaging of the chest
is focused on cardiac structures and excludes much of the chest on
CT.
FINDINGS: Cardiovascular: Please see dedicated cardiovascular report for
further details regarding cardiac structures.

Mediastinum/Nodes: No acute process or adenopathy in the visible
portions of the mediastinum.

Lungs/Pleura: Mild elevation of the LEFT hemidiaphragm. No priors
for comparison. Lungs are clear to the extent evaluated. No sign of
pleural effusion. Visible airways are patent.

Upper Abdomen: Hepatic steatosis.  No acute upper abdominal process.

Musculoskeletal: No acute or destructive bone findings.
IMPRESSION: 1. No acute process in the chest. Elevation of the LEFT
hemidiaphragm is of uncertain significance, slightly atypical.
Correlation with more remote imaging may be helpful and would also
correlate with any respiratory symptoms, findings could also be due
to patient position and related to body habitus.
2. Mild hepatic steatosis.

*** End of Addendum ***
FINDINGS: Coronary Calcium Score:

Left main: 0

Left anterior descending artery: 0

Left circumflex artery: 0

Right coronary artery: 0, small focus in proximal RCA that is below
the detection threshold of this scan.

Total: 0

Pericardium: Normal.

Ascending Aorta: Normal caliber. Ascending aorta measures
approximately 34mm at the mid ascending aorta measured in an axial
plane.

Non-cardiac: See separate report from [REDACTED].
IMPRESSION: Coronary calcium score of 0.



If CAC=0, it is reasonable to withhold statin therapy and reassess
in 5 to 10 years, as long as higher risk conditions are absent
(diabetes mellitus, family history of premature CHD in first degree
relatives (males <55 years; females <65 years), cigarette smoking,
or LDL >=190 mg/dL).

If CAC is 1 to 99, it is reasonable to initiate statin therapy for
patients >=55 years of age.

If CAC is >=100 or >=75th percentile, it is reasonable to initiate
statin therapy at any age.

Cardiology referral should be considered for patients with CAC
scores >=400 or >=75th percentile.

*3543 AHA/ACC/AACVPR/AAPA/ABC/MADU/CHAMIKA/ANTANIA/Megaptche/AMAZIGH/SUKAZI/BURDAK
Guideline on the Management of Blood Cholesterol: A Report of the
American College of Cardiology/American Heart Association Task Force
on Clinical Practice Guidelines. J Am Coll Cardiol.
2577;73(24):5809-5695.

## 2023-03-17 NOTE — Telephone Encounter (Signed)
Reached out to [facility] they have been trying to reach the patient for cpap setup. I have sent a mychart message.

## 2023-04-10 ENCOUNTER — Telehealth: Payer: Self-pay | Admitting: Internal Medicine

## 2023-04-10 NOTE — Telephone Encounter (Signed)
Patient stated she received her CPAP equipment and will need to schedule a sleep visit between 05/02/23 and 07/01/23.

## 2023-04-15 LAB — TSH: TSH: 0.234 u[IU]/mL — ABNORMAL LOW (ref 0.450–4.500)

## 2023-04-15 LAB — T4, FREE: Free T4: 1.4 ng/dL (ref 0.82–1.77)

## 2023-04-22 ENCOUNTER — Ambulatory Visit (INDEPENDENT_AMBULATORY_CARE_PROVIDER_SITE_OTHER): Payer: BC Managed Care – PPO | Admitting: Nurse Practitioner

## 2023-04-22 ENCOUNTER — Encounter: Payer: Self-pay | Admitting: Nurse Practitioner

## 2023-04-22 VITALS — BP 132/79 | HR 60 | Ht 61.0 in | Wt 165.4 lb

## 2023-04-22 DIAGNOSIS — R5382 Chronic fatigue, unspecified: Secondary | ICD-10-CM | POA: Diagnosis not present

## 2023-04-22 DIAGNOSIS — E039 Hypothyroidism, unspecified: Secondary | ICD-10-CM

## 2023-04-22 NOTE — Patient Instructions (Signed)

## 2023-04-22 NOTE — Progress Notes (Signed)
Endocrinology Follow Up Note                                         04/22/2023, 4:37 PM  Subjective:   Subjective    Heidi Phelps is a 52 y.o.-year-old female patient being seen in follow up after being seen in consultation for hypothyroidism referred by Dion Saucier, PA-C.   Past Medical History:  Diagnosis Date   Chest pain    Hyperlipidemia    Hypertension    Hypothyroidism    Neck strain     Past Surgical History:  Procedure Laterality Date   BACK SURGERY     Gallbladder      Social History   Socioeconomic History   Marital status: Married    Spouse name: Not on file   Number of children: Not on file   Years of education: Not on file   Highest education level: Not on file  Occupational History   Not on file  Tobacco Use   Smoking status: Never   Smokeless tobacco: Never  Vaping Use   Vaping status: Never Used  Substance and Sexual Activity   Alcohol use: Never   Drug use: Never   Sexual activity: Not on file  Other Topics Concern   Not on file  Social History Narrative   Not on file   Social Drivers of Health   Financial Resource Strain: Not on file  Food Insecurity: No Food Insecurity (07/05/2022)   Received from [facility], [facility]   Hunger Vital Sign    Worried About Running Out of Food in the Last Year: Never true    Ran Out of Food in the Last Year: Never true  Transportation Needs: Not on file  Physical Activity: Inactive (05/24/2021)   Received from [facility], [facility]   Exercise Vital Sign    Days of Exercise per Week: 0 days    Minutes of Exercise per Session: 0 min  Stress: Stress Concern Present (05/24/2021)   Received from [facility], [facility] - Occupational Stress Questionnaire    Feeling of Stress : Rather much  Social Connections: Not on file    Family History  Problem  Relation Age of Onset   Thyroid disease Mother    Hypertension Mother    Diabetes Mother    COPD Mother    Emphysema Mother    Hyperlipidemia Mother    Diabetes Father    Kidney disease Father    Hypertension Father    Thyroid disease Brother     Outpatient Encounter Medications as of 04/22/2023  Medication Sig   levonorgestrel (MIRENA) 20 MCG/DAY IUD by Intrauterine route.   levothyroxine (SYNTHROID) 112 MCG tablet Take 1 tablet (112 mcg total) by mouth daily before breakfast.   meloxicam (MOBIC) 15 MG tablet Take 15 mg by mouth daily.   omeprazole (PRILOSEC) 10 MG capsule Take 10 mg by mouth as needed.  lisinopril (ZESTRIL) 20 MG tablet Take 1 tablet (20 mg total) by mouth daily.   rosuvastatin (CRESTOR) 5 MG tablet Take 1 tablet (5 mg total) by mouth daily.   No facility-administered encounter medications on file as of 04/22/2023.    ALLERGIES: No Known Allergies VACCINATION STATUS:  There is no immunization history on file for this patient.   HPI   Heidi Phelps  is a patient with the above medical history. she was diagnosed with hypothyroidism at approximate age of 21 years, which required subsequent initiation of thyroid hormone replacement. she was given various doses of Levothyroxine over the years, currently on 125 micrograms. she reports compliance to this medication:  Taking it daily on empty stomach separate from other medications.  I reviewed patient's thyroid tests:  Lab Results  Component Value Date   TSH 0.234 (L) 04/14/2023   TSH 0.043 (L) 01/08/2023   TSH 0.009 (L) 10/02/2022   TSH 3.890 07/02/2022   TSH 0.087 (L) 02/26/2022   TSH 0.015 (L) 10/18/2021   TSH 0.009 (L) 08/22/2021   TSH 0.021 (L) 07/18/2021   FREET4 1.40 04/14/2023   FREET4 1.79 (H) 01/08/2023   FREET4 1.97 (H) 10/02/2022   FREET4 1.41 07/02/2022   FREET4 1.57 02/26/2022   FREET4 1.77 10/18/2021   FREET4 1.59 08/22/2021   FREET4 2.00 (H) 07/18/2021    Pt denies feeling  nodules in neck, hoarseness, dysphagia/odynophagia, SOB with lying down.  she does have extensive family history of thyroid disorders in her mother, father, siblings and grandparents (hypothyroidism).  No family history of thyroid cancer.  No history of radiation therapy to head or neck.  No recent use of iodine supplements.  Denies use of Biotin containing supplements.  I reviewed her chart and she also has a history of psoriasis.   Review of systems  Constitutional: + stable body weight,  current Body mass index is 31.25 kg/m. , + fatigue-worsening recently no subjective hyperthermia, no subjective hypothermia Eyes: no blurry vision, no xerophthalmia ENT: no sore throat, no nodules palpated in throat, no dysphagia/odynophagia, no hoarseness Cardiovascular: no chest pain, no shortness of breath, no palpitations, no leg swelling Respiratory: no cough, no shortness of breath Gastrointestinal: no nausea/vomiting/diarrhea Musculoskeletal: left chest pain that radiates down left arm and into left neck.  Says it gets worse with deep breathing, coughing or laughing.  She notes she thinks she pulled a muscle. Skin: no rashes, no hyperemia, + hair loss Neurological: no tremors, no numbness, no tingling, no dizziness Psychiatric: no depression, no anxiety   Objective:   Objective     BP 132/79 (BP Location: Right Arm, Patient Position: Sitting)   Pulse 60   Ht 5\' 1"  (1.549 m)   Wt 165 lb 6.4 oz (75 kg)   BMI 31.25 kg/m  Wt Readings from Last 3 Encounters:  04/22/23 165 lb 6.4 oz (75 kg)  01/14/23 161 lb 6.4 oz (73.2 kg)  11/25/22 163 lb 9.6 oz (74.2 kg)    BP Readings from Last 3 Encounters:  04/22/23 132/79  01/14/23 117/77  11/25/22 124/74      Physical Exam- Limited  Constitutional:  Body mass index is 31.25 kg/m. , not in acute distress, normal state of mind Eyes:  EOMI, no exophthalmos Cardiovascular: RRR, no murmurs, rubs, or gallops, no edema Musculoskeletal: no  gross deformities, strength intact in all four extremities, no gross restriction of joint movements Skin:  no rashes, no hyperemia Neurological: no tremor with outstretched hands   CMP (  most recent) CMP     Component Value Date/Time   NA 136 11/25/2022 1409   NA 140 10/09/2022 1530   K 4.6 11/25/2022 1409   CL 101 11/25/2022 1409   CO2 25 11/25/2022 1409   GLUCOSE 100 (H) 11/25/2022 1409   BUN 44 (H) 11/25/2022 1409   BUN 31 (H) 10/09/2022 1530   CREATININE 1.20 (H) 11/25/2022 1409   CALCIUM 9.7 11/25/2022 1409   PROT 7.8 11/25/2022 1409   PROT 6.7 10/09/2022 1530   ALBUMIN 4.3 11/25/2022 1409   ALBUMIN 4.4 10/09/2022 1530   AST 19 11/25/2022 1409   ALT 20 11/25/2022 1409   ALKPHOS 55 11/25/2022 1409   BILITOT 0.4 11/25/2022 1409   BILITOT 0.3 10/09/2022 1530   GFRNONAA 54 (L) 11/25/2022 1409     Diabetic Labs (most recent): Lab Results  Component Value Date   HGBA1C 5.2 11/25/2022     Lipid Panel ( most recent) Lipid Panel     Component Value Date/Time   TRIG 78 11/25/2022 1409   HDL 52 11/25/2022 1409       Lab Results  Component Value Date   TSH 0.234 (L) 04/14/2023   TSH 0.043 (L) 01/08/2023   TSH 0.009 (L) 10/02/2022   TSH 3.890 07/02/2022   TSH 0.087 (L) 02/26/2022   TSH 0.015 (L) 10/18/2021   TSH 0.009 (L) 08/22/2021   TSH 0.021 (L) 07/18/2021   FREET4 1.40 04/14/2023   FREET4 1.79 (H) 01/08/2023   FREET4 1.97 (H) 10/02/2022   FREET4 1.41 07/02/2022   FREET4 1.57 02/26/2022   FREET4 1.77 10/18/2021   FREET4 1.59 08/22/2021   FREET4 2.00 (H) 07/18/2021     Latest Reference Range & Units 10/18/21 15:45 02/26/22 16:01 07/02/22 08:27 10/02/22 09:30 01/08/23 08:21 04/14/23 08:25  TSH 0.450 - 4.500 uIU/mL 0.015 (L) 0.087 (L) 3.890 0.009 (L) 0.043 (L) 0.234 (L)  T4,Free(Direct) 0.82 - 1.77 ng/dL 3.47 4.25 9.56 3.87 (H) 1.79 (H) 1.40  (L): Data is abnormally low (H): Data is abnormally high  Assessment & Plan:   ASSESSMENT / PLAN:  1.  Hypothyroidism-acquired  Patient with long-standing hypothyroidism, on levothyroxine therapy. On physical exam, patient does not have gross goiter, thyroid nodules, or neck compression symptoms.    Her repeat thyroid function tests are consistent with appropriate hormone replacement.  She is advised to continue Levothyroxine 112 mcg po daily before breakfast.    - We discussed about correct intake of levothyroxine, at fasting, with water, separated by at least 30 minutes from breakfast, and separated by more than 4 hours from calcium, iron, multivitamins, acid reflux medications (PPIs). -Patient is made aware of the fact that thyroid hormone replacement is needed for life, dose to be adjusted by periodic monitoring of thyroid function tests.  2. Fatigue She notes she tends to have good and bad days, more recently has seen more days where is is really fatigued.  She notes she recently started using her CPap machine.  Will check vitamin d, b12, ferritin, iron panel to help identify etiology of her fatigue since it does not appear to be related to her thyroid.    I did give her information on WFPB lifestyle changes which can help with her overall symptoms. The following Lifestyle Medicine recommendations according to American College of Lifestyle Medicine [facility]) were discussed and offered to patient and she agrees to start the journey:  A. Whole Foods, Plant-based plate comprising of fruits and vegetables, plant-based proteins, whole-grain carbohydrates was discussed in  detail with the patient.   A list for source of those nutrients were also provided to the patient.  Patient will use only water or unsweetened tea for hydration. B.  The need to stay away from risky substances including alcohol, smoking; obtaining 7 to 9 hours of restorative sleep, at least 150 minutes of moderate intensity exercise weekly, the importance of healthy social connections,  and stress reduction techniques were  discussed. C.  A full color page of  Calorie density of various food groups per pound showing examples of each food groups was provided to the patient.  -She also asked about several different supplements today, we went over safe supplements to take with her thyroid condition.     I spent  26  minutes in the care of the patient today including review of labs from Thyroid Function, CMP, and other relevant labs ; imaging/biopsy records (current and previous including abstractions from other facilities); face-to-face time discussing  her lab results and symptoms, medications doses, her options of short and long term treatment based on the latest standards of care / guidelines;   and documenting the encounter.  [name]  participated in the discussions, expressed understanding, and voiced agreement with the above plans.  All questions were answered to her satisfaction. she is encouraged to contact clinic should she have any questions or concerns prior to her return visit.   FOLLOW UP PLAN:  Return in about 3 months (around 07/21/2023) for Thyroid follow up, Previsit labs.  Ronny Bacon, [facility] [address] [place], [place] [address] Phone: [phone] Fax: [phone]  04/22/2023, 4:37 PM

## 2023-04-23 ENCOUNTER — Ambulatory Visit: Payer: BC Managed Care – PPO | Admitting: Nurse Practitioner

## 2023-06-01 ENCOUNTER — Encounter: Payer: Self-pay | Admitting: Cardiology

## 2023-06-01 DIAGNOSIS — G4733 Obstructive sleep apnea (adult) (pediatric): Secondary | ICD-10-CM | POA: Insufficient documentation

## 2023-06-01 NOTE — Progress Notes (Unsigned)
SLEEP MEDICINE VIRTUAL CONSULT NOTE Video Note   Because of Heidi Phelps co-morbid illnesses, she is at least at moderate risk for complications without adequate follow up.  This format is felt to be most appropriate for this patient at this time.  All issues noted in this document were discussed and addressed.  A limited physical exam was performed with this format.  Please refer to the patient's chart for her consent to telehealth for [facility].      Date:  06/02/2023   ID:  Heidi Phelps, DOB 01-19-1971, MRN [id number] The patient was identified using 2 identifiers.  Patient Location: Home Provider Location: Home Office   PCP:  Antonietta Jewel   [facility] Providers Cardiologist:  Dietrich Pates, MD     Evaluation Performed:  New Patient Evaluation  Chief Complaint:  OSA  History of Present Illness:    Heidi Phelps is a 53 y.o. female who is being seen today for the evaluation of OSA at the request of Dietrich Pates, MD.  Heidi Phelps is a 53 y.o. female with a hx of HLD, HTN and CP.  Seen by Dr. Tenny Craw 11/2022 and complained of snoring.  She says that her husband complained of loud snoring and she felt unrested when she would wake up in the am and would get sleepy during the day. She underwent HST showing moderate OSA with an AHI of 15.2/hr and was started on auto CPAP at 4-15cm H2O.  She is now referred for Sleep Medicine consult for evaluation and treatment of OSA.  She is doing well with her PAP device and thinks that she has gotten used to it.  She tolerates the full face mask but has some problems with the mask moving.  She feels the pressure is adequate except when she first puts the mask on when she feels the pressure is not enough.  Since going on PAP she feels rested in the am and has no significant daytime sleepiness.  She denies any significant mouth (except for when her mouth falls open) or nasal dryness or nasal congestion.  She  does not think that she snores.    Past Medical History:  Diagnosis Date   Chest pain    Hyperlipidemia    Hypertension    Hypothyroidism    Neck strain    OSA on CPAP    moderate OSA with an AHI of 15.2/hr and was started on auto CPAP at 4-15cm H2O   Past Surgical History:  Procedure Laterality Date   BACK SURGERY     Gallbladder       Current Meds  Medication Sig   levonorgestrel (MIRENA) 20 MCG/DAY IUD by Intrauterine route.   levothyroxine (SYNTHROID) 112 MCG tablet Take 1 tablet (112 mcg total) by mouth daily before breakfast.   meloxicam (MOBIC) 15 MG tablet Take 15 mg by mouth daily.   omeprazole (PRILOSEC) 10 MG capsule Take 10 mg by mouth as needed.     Allergies:   Patient has no known allergies.   Social History   Tobacco Use   Smoking status: Never   Smokeless tobacco: Never  Vaping Use   Vaping status: Never Used  Substance Use Topics   Alcohol use: Never   Drug use: Never     Family Hx: The patient's family history includes COPD in her mother; Diabetes in her father and mother; Emphysema in her mother; Hyperlipidemia in her mother; Hypertension  in her father and mother; Kidney disease in her father; Thyroid disease in her brother and mother.  ROS:   Please see the history of present illness.     All other systems reviewed and are negative.   Prior Sleep studies:   The following studies were reviewed today:  HST, PAP compliance download  Labs/Other Tests and Data Reviewed:     Recent Labs: 10/09/2022: Magnesium 1.8 11/25/2022: ALT 20; BUN 44; Creatinine, Ser 1.20; Potassium 4.6; Sodium 136 04/14/2023: TSH 0.234    Wt Readings from Last 3 Encounters:  06/02/23 168 lb (76.2 kg)  04/22/23 165 lb 6.4 oz (75 kg)  01/14/23 161 lb 6.4 oz (73.2 kg)     Risk Assessment/Calculations:      STOP-Bang Score:  4      Objective:    Vital Signs:  BP 109/69   Pulse 61   Ht 5\' 1"  (1.549 m)   Wt 168 lb (76.2 kg)   BMI 31.74 kg/m    VITAL SIGNS:   reviewed GEN:  no acute distress EYES:  sclerae anicteric, EOMI - Extraocular Movements Intact RESPIRATORY:  normal respiratory effort, symmetric expansion CARDIOVASCULAR:  no peripheral edema SKIN:  no rash, lesions or ulcers. MUSCULOSKELETAL:  no obvious deformities. NEURO:  alert and oriented x 3, no obvious focal deficit PSYCH:  normal affect  ASSESSMENT & PLAN:    OSA - The patient is tolerating PAP therapy well without any problems. The PAP download performed by his DME was personally reviewed and interpreted by me today and showed an AHI of 0.9/hr on auto CPAP from 4 to 15 cm H2O with 87% compliance in using more than 4 hours nightly.  The patient has been using and benefiting from PAP use and will continue to benefit from therapy.  -since she feels the pressure is too low when she first starts off and her 95th% pressure was 14cm H2O and I will change to a set pressure of 14cm H2O  HTN -BP controlled on exam today -continue prescription drug management with Lisinopril 20mg  daily with PRN refills   Time:   Today, I have spent 15 minutes with the patient with telehealth technology discussing the above problems.     Medication Adjustments/Labs and Tests Ordered: Current medicines are reviewed at length with the patient today.  Concerns regarding medicines are outlined above.   Tests Ordered: No orders of the defined types were placed in this encounter.   Medication Changes: No orders of the defined types were placed in this encounter.   Follow Up:  In Person in 1 year(s)  Signed, Armanda Magic, MD  06/02/2023 8:20 AM    [facility]

## 2023-06-02 ENCOUNTER — Ambulatory Visit: Payer: 59 | Attending: Cardiology | Admitting: Cardiology

## 2023-06-02 VITALS — BP 109/69 | HR 61 | Ht 61.0 in | Wt 168.0 lb

## 2023-06-02 DIAGNOSIS — G4733 Obstructive sleep apnea (adult) (pediatric): Secondary | ICD-10-CM

## 2023-06-02 DIAGNOSIS — I10 Essential (primary) hypertension: Secondary | ICD-10-CM

## 2023-06-02 NOTE — Patient Instructions (Signed)
Medication Instructions:  Your physician recommends that you continue on your current medications as directed. Please refer to the Current Medication list given to you today.  *If you need a refill on your cardiac medications before your next appointment, please call your pharmacy*   Lab Work: None.  If you have labs (blood work) drawn today and your tests are completely normal, you will receive your results only by: MyChart Message (if you have MyChart) OR A paper copy in the mail If you have any lab test that is abnormal or we need to change your treatment, we will call you to review the results.   Testing/Procedures: None.   Follow-Up:  Your next appointment:   1 year(s)  Provider:   Dr. Armanda Magic, MD   Other Instructions Dr. Mayford Knife has sent orders to your DME company. Someone may contact you from your DME company to follow up.

## 2023-06-03 ENCOUNTER — Other Ambulatory Visit: Payer: Self-pay

## 2023-06-03 ENCOUNTER — Ambulatory Visit: Payer: 59 | Attending: Internal Medicine

## 2023-06-03 ENCOUNTER — Telehealth: Payer: Self-pay

## 2023-06-03 DIAGNOSIS — R002 Palpitations: Secondary | ICD-10-CM

## 2023-06-03 DIAGNOSIS — G4733 Obstructive sleep apnea (adult) (pediatric): Secondary | ICD-10-CM

## 2023-06-03 DIAGNOSIS — I10 Essential (primary) hypertension: Secondary | ICD-10-CM

## 2023-06-03 NOTE — Telephone Encounter (Signed)
-----   Message from Dietrich Pates sent at 06/03/2023  1:38 PM EST ----- Set patient up for zio patch 2 wk ----- Message ----- From: Quintella Reichert, MD Sent: 06/03/2023  11:15 AM EST To: Pricilla Riffle, MD  I got a message from the home ambulatory cardiac monitoring for [employer] who also does overnight oxygen testing.  I got an Lindell Spar on the patient on CPAP who is your patient they sent me a form saying that there was some possibility based on their testing algorithm the patient may be having atrial arrhythmias.  They wanted to know if I wanted to order any further testing and I am going to defer that to you

## 2023-06-03 NOTE — Telephone Encounter (Signed)
Pricilla Riffle, MD  Bertram Millard, RN Set patient up for zio patch 2 wk       Previous Messages    ----- Message ----- From: Quintella Reichert, MD Sent: 06/03/2023  11:15 AM EST To: Pricilla Riffle, MD  I got a message from the home ambulatory cardiac monitoring for [employer] who also does overnight oxygen testing.  I got an Lindell Spar on the patient on CPAP who is your patient they sent me a form saying that there was some possibility based on their testing algorithm the patient may be having atrial arrhythmias.  They wanted to know if I wanted to order any further testing and I am going to defer that to you  Zio ordered. Pt advised and verbalized understanding.

## 2023-06-03 NOTE — Progress Notes (Signed)
Patient seen in office, Per Dr. Mayford Knife, settings changed in [facility] and sent to [facility]. Set Mode to CPAP Set Set pressure to 14.0 cmH2O Set EPR to Ramp Only Set EPR level to 3 Set Ramp enable to Auto Set Start pressure to 4.0 cmH2O Set Climate Control to Auto Set Humidifier level to Auto Set Tube temperature to Auto

## 2023-06-03 NOTE — Progress Notes (Unsigned)
Enrolled for [employer] to mail a ZIO XT long term holter monitor to the patients address on file.

## 2023-06-07 DIAGNOSIS — R002 Palpitations: Secondary | ICD-10-CM | POA: Diagnosis not present

## 2023-07-03 ENCOUNTER — Encounter: Payer: Self-pay | Admitting: Internal Medicine

## 2023-07-24 LAB — COMPREHENSIVE METABOLIC PANEL
ALT: 15 IU/L (ref 0–32)
AST: 19 IU/L (ref 0–40)
Albumin: 4.4 g/dL (ref 3.8–4.9)
Alkaline Phosphatase: 73 IU/L (ref 44–121)
BUN/Creatinine Ratio: 17 (ref 9–23)
BUN: 15 mg/dL (ref 6–24)
Bilirubin Total: 0.2 mg/dL (ref 0.0–1.2)
CO2: 25 mmol/L (ref 20–29)
Calcium: 10.3 mg/dL — ABNORMAL HIGH (ref 8.7–10.2)
Chloride: 104 mmol/L (ref 96–106)
Creatinine, Ser: 0.89 mg/dL (ref 0.57–1.00)
Globulin, Total: 2.6 g/dL (ref 1.5–4.5)
Glucose: 116 mg/dL — ABNORMAL HIGH (ref 70–99)
Potassium: 5.1 mmol/L (ref 3.5–5.2)
Sodium: 142 mmol/L (ref 134–144)
Total Protein: 7 g/dL (ref 6.0–8.5)
eGFR: 77 mL/min/{1.73_m2} (ref >59)

## 2023-07-24 LAB — B12 AND FOLATE PANEL
Folate: 9.7 ng/mL (ref >3.0)
Vitamin B-12: 394 pg/mL (ref 232–1245)

## 2023-07-24 LAB — IRON,TIBC AND FERRITIN PANEL
Ferritin: 61 ng/mL (ref 15–150)
Iron Saturation: 16 % (ref 15–55)
Iron: 57 ug/dL (ref 27–159)
Total Iron Binding Capacity: 350 ug/dL (ref 250–450)
UIBC: 293 ug/dL (ref 131–425)

## 2023-07-24 LAB — VITAMIN D 25 HYDROXY (VIT D DEFICIENCY, FRACTURES): Vit D, 25-Hydroxy: 31.1 ng/mL (ref 30.0–100.0)

## 2023-07-24 LAB — TSH: TSH: 1.22 u[IU]/mL (ref 0.450–4.500)

## 2023-07-24 LAB — T4, FREE: Free T4: 1.48 ng/dL (ref 0.82–1.77)

## 2023-07-28 NOTE — Patient Instructions (Signed)

## 2023-07-30 ENCOUNTER — Ambulatory Visit (INDEPENDENT_AMBULATORY_CARE_PROVIDER_SITE_OTHER): Payer: BC Managed Care – PPO | Admitting: Nurse Practitioner

## 2023-07-30 ENCOUNTER — Encounter: Payer: Self-pay | Admitting: Nurse Practitioner

## 2023-07-30 VITALS — BP 102/80 | HR 62 | Ht 61.0 in | Wt 176.0 lb

## 2023-07-30 DIAGNOSIS — E039 Hypothyroidism, unspecified: Secondary | ICD-10-CM

## 2023-07-30 MED ORDER — LEVOTHYROXINE SODIUM 112 MCG PO TABS
112.0000 ug | ORAL_TABLET | Freq: Every day | ORAL | 3 refills | Status: DC
Start: 2023-07-30 — End: 2024-03-22

## 2023-07-30 NOTE — Progress Notes (Signed)
 Endocrinology Follow Up Note                                         07/30/2023, 3:33 PM  Subjective:   Subjective    Heidi Phelps is a 53 y.o.-year-old female patient being seen in follow up after being seen in consultation for hypothyroidism referred by Dion Saucier, PA-C.   Past Medical History:  Diagnosis Date   Chest pain    Hyperlipidemia    Hypertension    Hypothyroidism    Neck strain    OSA on CPAP    moderate OSA with an AHI of 15.2/hr and was started on auto CPAP at 4-15cm H2O    Past Surgical History:  Procedure Laterality Date   BACK SURGERY     Gallbladder      Social History   Socioeconomic History   Marital status: Married    Spouse name: Not on file   Number of children: Not on file   Years of education: Not on file   Highest education level: Not on file  Occupational History   Not on file  Tobacco Use   Smoking status: Never   Smokeless tobacco: Never  Vaping Use   Vaping status: Never Used  Substance and Sexual Activity   Alcohol use: Never   Drug use: Never   Sexual activity: Not on file  Other Topics Concern   Not on file  Social History Narrative   Not on file   Social Drivers of Health   Financial Resource Strain: Not on file  Food Insecurity: No Food Insecurity (07/05/2022)   Received from [facility], [facility]   Hunger Vital Sign    Worried About Running Out of Food in the Last Year: Never true    Ran Out of Food in the Last Year: Never true  Transportation Needs: Not on file  Physical Activity: Inactive (05/24/2021)   Received from [facility], [facility]   Exercise Vital Sign    Days of Exercise per Week: 0 days    Minutes of Exercise per Session: 0 min  Stress: Stress Concern Present (05/24/2021)   Received from [facility] - Occupational Stress Questionnaire     Feeling of Stress : Rather much  Social Connections: Not on file    Family History  Problem Relation Age of Onset   Thyroid disease Mother    Hypertension Mother    Diabetes Mother    COPD Mother    Emphysema Mother    Hyperlipidemia Mother    Diabetes Father    Kidney disease Father    Hypertension Father    Thyroid disease Brother     Outpatient Encounter Medications as of 07/30/2023  Medication Sig   doxycycline (VIBRA-TABS) 100 MG tablet Take 100 mg by mouth 2 (two) times daily.   levonorgestrel (MIRENA) 20 MCG/DAY IUD by Intrauterine route.   lisinopril (ZESTRIL) 20  MG tablet Take 1 tablet (20 mg total) by mouth daily.   meloxicam (MOBIC) 15 MG tablet Take 15 mg by mouth daily.   omeprazole (PRILOSEC) 10 MG capsule Take 10 mg by mouth as needed.   [DISCONTINUED] levothyroxine (SYNTHROID) 112 MCG tablet Take 1 tablet (112 mcg total) by mouth daily before breakfast.   levothyroxine (SYNTHROID) 112 MCG tablet Take 1 tablet (112 mcg total) by mouth daily before breakfast.   rosuvastatin (CRESTOR) 5 MG tablet Take 1 tablet (5 mg total) by mouth daily.   No facility-administered encounter medications on file as of 07/30/2023.    ALLERGIES: No Known Allergies VACCINATION STATUS:  There is no immunization history on file for this patient.   HPI   Heidi Phelps  is a patient with the above medical history. she was diagnosed with hypothyroidism at approximate age of 21 years, which required subsequent initiation of thyroid hormone replacement. she was given various doses of Levothyroxine over the years, currently on 125 micrograms. she reports compliance to this medication:  Taking it daily on empty stomach separate from other medications.  I reviewed patient's thyroid tests:  Lab Results  Component Value Date   TSH 1.220 07/23/2023   TSH 0.234 (L) 04/14/2023   TSH 0.043 (L) 01/08/2023   TSH 0.009 (L) 10/02/2022   TSH 3.890 07/02/2022   TSH 0.087 (L) 02/26/2022   TSH  0.015 (L) 10/18/2021   TSH 0.009 (L) 08/22/2021   TSH 0.021 (L) 07/18/2021   FREET4 1.48 07/23/2023   FREET4 1.40 04/14/2023   FREET4 1.79 (H) 01/08/2023   FREET4 1.97 (H) 10/02/2022   FREET4 1.41 07/02/2022   FREET4 1.57 02/26/2022   FREET4 1.77 10/18/2021   FREET4 1.59 08/22/2021   FREET4 2.00 (H) 07/18/2021    Pt denies feeling nodules in neck, hoarseness, dysphagia/odynophagia, SOB with lying down.  she does have extensive family history of thyroid disorders in her mother, father, siblings and grandparents (hypothyroidism).  No family history of thyroid cancer.  No history of radiation therapy to head or neck.  No recent use of iodine supplements.  Denies use of Biotin containing supplements.  I reviewed her chart and she also has a history of psoriasis.   Review of systems  Constitutional: + increasing body weight (admits to stress eating lately),  current Body mass index is 33.25 kg/m. , + fatigue no subjective hyperthermia, no subjective hypothermia Eyes: no blurry vision, no xerophthalmia ENT: no sore throat, no nodules palpated in throat, no dysphagia/odynophagia, no hoarseness Cardiovascular: no chest pain, no shortness of breath, no palpitations, no leg swelling Respiratory: no cough, no shortness of breath Gastrointestinal: no nausea/vomiting/diarrhea Musculoskeletal: no muscle/joint pain Skin: no rashes, no hyperemia, + hair loss Neurological: no tremors, no numbness, no tingling, no dizziness Psychiatric: no depression, no anxiety   Objective:   Objective     BP 102/80 (BP Location: Right Arm, Patient Position: Sitting, Cuff Size: Large)   Pulse 62   Ht 5\' 1"  (1.549 m)   Wt 176 lb (79.8 kg)   BMI 33.25 kg/m  Wt Readings from Last 3 Encounters:  07/30/23 176 lb (79.8 kg)  06/02/23 168 lb (76.2 kg)  04/22/23 165 lb 6.4 oz (75 kg)    BP Readings from Last 3 Encounters:  07/30/23 102/80  06/02/23 109/69  04/22/23 132/79       Physical Exam-  Limited  Constitutional:  Body mass index is 33.25 kg/m. , not in acute distress, normal state of mind Eyes:  EOMI, no exophthalmos Musculoskeletal: no gross deformities, strength intact in all four extremities, no gross restriction of joint movements Skin:  no rashes, no hyperemia Neurological: no tremor with outstretched hands   CMP ( most recent) CMP     Component Value Date/Time   NA 142 07/23/2023 0933   K 5.1 07/23/2023 0933   CL 104 07/23/2023 0933   CO2 25 07/23/2023 0933   GLUCOSE 116 (H) 07/23/2023 0933   GLUCOSE 100 (H) 11/25/2022 1409   BUN 15 07/23/2023 0933   CREATININE 0.89 07/23/2023 0933   CALCIUM 10.3 (H) 07/23/2023 0933   PROT 7.0 07/23/2023 0933   ALBUMIN 4.4 07/23/2023 0933   AST 19 07/23/2023 0933   ALT 15 07/23/2023 0933   ALKPHOS 73 07/23/2023 0933   BILITOT 0.2 07/23/2023 0933   GFRNONAA 54 (L) 11/25/2022 1409     Diabetic Labs (most recent): Lab Results  Component Value Date   HGBA1C 5.2 11/25/2022     Lipid Panel ( most recent) Lipid Panel     Component Value Date/Time   TRIG 78 11/25/2022 1409   HDL 52 11/25/2022 1409       Lab Results  Component Value Date   TSH 1.220 07/23/2023   TSH 0.234 (L) 04/14/2023   TSH 0.043 (L) 01/08/2023   TSH 0.009 (L) 10/02/2022   TSH 3.890 07/02/2022   TSH 0.087 (L) 02/26/2022   TSH 0.015 (L) 10/18/2021   TSH 0.009 (L) 08/22/2021   TSH 0.021 (L) 07/18/2021   FREET4 1.48 07/23/2023   FREET4 1.40 04/14/2023   FREET4 1.79 (H) 01/08/2023   FREET4 1.97 (H) 10/02/2022   FREET4 1.41 07/02/2022   FREET4 1.57 02/26/2022   FREET4 1.77 10/18/2021   FREET4 1.59 08/22/2021   FREET4 2.00 (H) 07/18/2021     Latest Reference Range & Units 10/18/21 15:45 02/26/22 16:01 07/02/22 08:27 10/02/22 09:30 01/08/23 08:21 04/14/23 08:25  TSH 0.450 - 4.500 uIU/mL 0.015 (L) 0.087 (L) 3.890 0.009 (L) 0.043 (L) 0.234 (L)  T4,Free(Direct) 0.82 - 1.77 ng/dL 4.09 8.11 9.14 7.82 (H) 1.79 (H) 1.40  (L): Data is  abnormally low (H): Data is abnormally high   Latest Reference Range & Units 07/23/23 09:33  COMPREHENSIVE METABOLIC PANEL  Rpt !  Sodium 134 - 144 mmol/L 142  Potassium 3.5 - 5.2 mmol/L 5.1  Chloride 96 - 106 mmol/L 104  CO2 20 - 29 mmol/L 25  Glucose 70 - 99 mg/dL 956 (H)  BUN 6 - 24 mg/dL 15  Creatinine 2.13 - 0.86 mg/dL 5.78  Calcium 8.7 - 46.9 mg/dL 62.9 (H)  BUN/Creatinine Ratio 9 - 23  17  eGFR >59 mL/min/1.73 77  Alkaline Phosphatase 44 - 121 IU/L 73  Albumin 3.8 - 4.9 g/dL 4.4  AST 0 - 40 IU/L 19  ALT 0 - 32 IU/L 15  Total Protein 6.0 - 8.5 g/dL 7.0  Total Bilirubin 0.0 - 1.2 mg/dL 0.2  Iron 27 - 528 ug/dL 57  UIBC 413 - 244 ug/dL 010  TIBC 272 - 536 ug/dL 644  Ferritin 15 - 034 ng/mL 61  Iron Saturation 15 - 55 % 16  Folate >3.0 ng/mL 9.7  Vitamin D, 25-Hydroxy 30.0 - 100.0 ng/mL 31.1  Vitamin B12 232 - 1,245 pg/mL 394  Globulin, Total 1.5 - 4.5 g/dL 2.6  TSH 7.425 - 9.563 uIU/mL 1.220  T4,Free(Direct) 0.82 - 1.77 ng/dL 8.75  !: Data is abnormal (H): Data is abnormally high Rpt: View report in Results Review for more  information  Assessment & Plan:   ASSESSMENT / PLAN:  1. Hypothyroidism-acquired  Patient with long-standing hypothyroidism, on levothyroxine therapy. On physical exam, patient does not have gross goiter, thyroid nodules, or neck compression symptoms.    Her repeat thyroid function tests are consistent with appropriate hormone replacement.  She is advised to continue Levothyroxine 112 mcg po daily before breakfast.    - We discussed about correct intake of levothyroxine, at fasting, with water, separated by at least 30 minutes from breakfast, and separated by more than 4 hours from calcium, iron, multivitamins, acid reflux medications (PPIs). -Patient is made aware of the fact that thyroid hormone replacement is needed for life, dose to be adjusted by periodic monitoring of thyroid function tests.  2. Fatigue She notes she tends to have good  and bad days, more recently has seen more days where is is really fatigued.  She notes she recently started using her CPap machine.  Will check vitamin d, b12, ferritin, iron panel to help identify etiology of her fatigue since it does not appear to be related to her thyroid.  3. Hypercalcemia This is the first incidence of hypercalcemia, mildly elevated at 10.3.  She does take a MVI.  She is not on hydrochlorothiazide.  No personal or family history of parathyroid problems, no osteoporosis, no kidney stones.  She does have history of low vitamin D, currently on replacement therapy.  Will check CMP, Calcium, PTH, Magnesium, Phosphorous and A1c with next labs to rule out parathyroid problem as well as screen for diabetes since glucose was elevated.  Her vitamin D was normal (she is on supplementation), B12, and iron panel were normal.  I did advise her to stop her multivitamin today.   I spent  35  minutes in the care of the patient today including review of labs from Thyroid Function, CMP, and other relevant labs ; imaging/biopsy records (current and previous including abstractions from other facilities); face-to-face time discussing  her lab results and symptoms, medications doses, her options of short and long term treatment based on the latest standards of care / guidelines;   and documenting the encounter.  [name]  participated in the discussions, expressed understanding, and voiced agreement with the above plans.  All questions were answered to her satisfaction. she is encouraged to contact clinic should she have any questions or concerns prior to her return visit.   FOLLOW UP PLAN:  Return in about 3 months (around 10/30/2023) for Thyroid follow up, Previsit labs.  Ronny Bacon, [facility] [address] [place], [place] [address] Phone: [phone] Fax: [phone]  07/30/2023, 3:33 PM

## 2023-10-28 LAB — T4, FREE: Free T4: 1.49 ng/dL (ref 0.82–1.77)

## 2023-10-28 LAB — TSH: TSH: 0.216 u[IU]/mL — ABNORMAL LOW (ref 0.450–4.500)

## 2023-10-29 LAB — COMPREHENSIVE METABOLIC PANEL WITH GFR
ALT: 18 IU/L (ref 0–32)
AST: 20 IU/L (ref 0–40)
Albumin: 4.6 g/dL (ref 3.8–4.9)
Alkaline Phosphatase: 68 IU/L (ref 44–121)
BUN/Creatinine Ratio: 17 (ref 9–23)
BUN: 16 mg/dL (ref 6–24)
Bilirubin Total: 0.4 mg/dL (ref 0.0–1.2)
CO2: 20 mmol/L (ref 20–29)
Calcium: 9.6 mg/dL (ref 8.7–10.2)
Chloride: 106 mmol/L (ref 96–106)
Creatinine, Ser: 0.94 mg/dL (ref 0.57–1.00)
Globulin, Total: 2.4 g/dL (ref 1.5–4.5)
Glucose: 144 mg/dL — ABNORMAL HIGH (ref 70–99)
Potassium: 5.1 mmol/L (ref 3.5–5.2)
Sodium: 143 mmol/L (ref 134–144)
Total Protein: 7 g/dL (ref 6.0–8.5)
eGFR: 73 mL/min/{1.73_m2} (ref >59)

## 2023-10-29 LAB — MAGNESIUM: Magnesium: 1.8 mg/dL (ref 1.6–2.3)

## 2023-10-29 LAB — PTH, INTACT AND CALCIUM: PTH: 72 pg/mL — ABNORMAL HIGH (ref 15–65)

## 2023-10-29 LAB — PHOSPHORUS: Phosphorus: 3 mg/dL (ref 3.0–4.3)

## 2023-10-29 LAB — HEMOGLOBIN A1C
Est. average glucose Bld gHb Est-mCnc: 117 mg/dL
Hgb A1c MFr Bld: 5.7 % — ABNORMAL HIGH (ref 4.8–5.6)

## 2023-11-03 NOTE — Progress Notes (Unsigned)
 Cardiology Office Note   Date:  11/05/2023   ID:  SHAKEITA VANDEVANDER, DOB November 19, 1970, MRN [id number]  PCP:  Vida Mardy DEL, PA-C  Cardiologist:   Vina Gull, MD   [name]    History of Present Illness: Heidi Phelps is a 53 y.o. female with a history of HTN, gestational DM, CP and HL    CP atypical   Pt also has a hx of OSA (on CPAP) 2023  Ca score CT   Ca score 0   Mild hepatic steatosis noted   Elevated L hemidiaphragm     When I saw the pt last she had lost some weight   She says since then the weight has come back   She is under increased stress   Husband working on the night shift   Eating schedule is off   She is snacking more  WOrk move is supposed to be temporary  She denies CP  Breathing is OK   No palpitaitons    Admits to not exercising like she should   Having problems with CPAP mask  Nose is getting pinched  Current Meds  Medication Sig   levonorgestrel (MIRENA) 20 MCG/DAY IUD by Intrauterine route.   levothyroxine  (SYNTHROID ) 112 MCG tablet Take 1 tablet (112 mcg total) by mouth daily before breakfast.   lisinopril  (ZESTRIL ) 20 MG tablet Take 1 tablet (20 mg total) by mouth daily.   meloxicam (MOBIC) 15 MG tablet Take 15 mg by mouth daily.   omeprazole (PRILOSEC) 10 MG capsule Take 10 mg by mouth as needed.     Allergies:   Patient has no known allergies.   Past Medical History:  Diagnosis Date   Chest pain    Hyperlipidemia    Hypertension    Hypothyroidism    Neck strain    OSA on CPAP    moderate OSA with an AHI of 15.2/hr and was started on auto CPAP at 4-15cm H2O    Past Surgical History:  Procedure Laterality Date   BACK SURGERY     Gallbladder       Social History:  The patient  reports that she has never smoked. She has never used smokeless tobacco. She reports that she does not drink alcohol and does not use drugs.   Family History:  The patient's family history includes COPD in her mother; Diabetes in her  father and mother; Emphysema in her mother; Hyperlipidemia in her mother; Hypertension in her father and mother; Kidney disease in her father; Thyroid  disease in her brother and mother.    ROS:  Please see the history of present illness. All other systems are reviewed and  Negative to the above problem except as noted.    PHYSICAL EXAM: VS:  BP 114/70   Pulse 63   Ht 5' 1 (1.549 m)   Wt 181 lb 6.4 oz (82.3 kg)   SpO2 97%   BMI 34.28 kg/m   GEN: Obese 53 year old in NAD HEENT: normal  Neck: no JVD, carotid bruits Cardiac: RRR; no murmurs, no LE  edema  Respiratory:  clear to auscultation  GI: soft, nontender,No hepatomegaly No masses  EKG:  EKG is ordered today.  Sinus rhythm 63 bpm  Nonspecific ST changes    Lipid Panel    Component Value Date/Time   TRIG 78 11/25/2022 1409   HDL 52 11/25/2022 1409      Wt Readings from Last 3 Encounters:  11/05/23 181  lb 6.4 oz (82.3 kg)  07/30/23 176 lb (79.8 kg)  06/02/23 168 lb (76.2 kg)      ASSESSMENT AND PLAN: 1.  Hx CP   PT denies symptoms   Ca score was 0      2  HTN BP remains well controlled     3  HL   Lipids were excellent in July 2024  I would hold checking for now   Diet is off  With risk factors keep on Crestor   4  Thyroid   Keep on synthroid    Has appt in endocrine with Dr Therisa  5.  OSA  Will review with T Turner   Question if any other mask can be tried    6  Metabolics   Reviewed diet  She knows what to do   No changes for now   Signed, Vina Gull, MD  11/05/2023 3:34 PM

## 2023-11-05 ENCOUNTER — Ambulatory Visit: Attending: Internal Medicine | Admitting: Internal Medicine

## 2023-11-05 ENCOUNTER — Encounter: Payer: Self-pay | Admitting: Internal Medicine

## 2023-11-05 VITALS — BP 114/70 | HR 63 | Ht 61.0 in | Wt 181.4 lb

## 2023-11-05 DIAGNOSIS — I10 Essential (primary) hypertension: Secondary | ICD-10-CM

## 2023-11-05 NOTE — Patient Instructions (Signed)
Medication Instructions:  Your physician recommends that you continue on your current medications as directed. Please refer to the Current Medication list given to you today.   Labwork: None today  Testing/Procedures: None today  Follow-Up: 1 year Dr.Ross   Any Other Special Instructions Will Be Listed Below (If Applicable).  If you need a refill on your cardiac medications before your next appointment, please call your pharmacy.  

## 2023-11-06 ENCOUNTER — Encounter: Payer: Self-pay | Admitting: Nurse Practitioner

## 2023-11-06 ENCOUNTER — Ambulatory Visit (INDEPENDENT_AMBULATORY_CARE_PROVIDER_SITE_OTHER): Admitting: Nurse Practitioner

## 2023-11-06 VITALS — BP 108/80 | HR 60 | Ht 61.0 in | Wt 183.6 lb

## 2023-11-06 DIAGNOSIS — E039 Hypothyroidism, unspecified: Secondary | ICD-10-CM | POA: Diagnosis not present

## 2023-11-06 DIAGNOSIS — E213 Hyperparathyroidism, unspecified: Secondary | ICD-10-CM

## 2023-11-06 NOTE — Patient Instructions (Signed)

## 2023-11-06 NOTE — Progress Notes (Signed)
 Endocrinology Follow Up Note                                         11/06/2023, 4:18 PM  Subjective:   Subjective    Heidi Phelps is a 53 y.o.-year-old female patient being seen in follow up after being seen in consultation for hypothyroidism referred by Vida Mardy DEL, PA-C.   Past Medical History:  Diagnosis Date   Chest pain    Hyperlipidemia    Hypertension    Hypothyroidism    Neck strain    OSA on CPAP    moderate OSA with an AHI of 15.2/hr and was started on auto CPAP at 4-15cm H2O    Past Surgical History:  Procedure Laterality Date   BACK SURGERY     Gallbladder      Social History   Socioeconomic History   Marital status: Married    Spouse name: Not on file   Number of children: Not on file   Years of education: Not on file   Highest education level: Not on file  Occupational History   Not on file  Tobacco Use   Smoking status: Never   Smokeless tobacco: Never  Vaping Use   Vaping status: Never Used  Substance and Sexual Activity   Alcohol use: Never   Drug use: Never   Sexual activity: Not on file  Other Topics Concern   Not on file  Social History Narrative   Not on file   Social Drivers of Health   Financial Resource Strain: Not on file  Food Insecurity: No Food Insecurity (07/05/2022)   Received from [facility]   Hunger Vital Sign    Within the past 12 months, you worried that your food would run out before you got the money to buy more.: Never true    Within the past 12 months, the food you bought just didn't last and you didn't have money to get more.: Never true  Transportation Needs: Not on file  Physical Activity: Inactive (05/24/2021)   Received from [facility]   Exercise Vital Sign    On average, how many days per week do you engage in moderate to strenuous exercise (like a brisk walk)?: 0 days    On average, how many minutes do you engage in exercise  at this level?: 0 min  Stress: Stress Concern Present (05/24/2021)   Received from [facility] - Occupational Stress Questionnaire    Feeling of Stress : Rather much  Social Connections: Not on file    Family History  Problem Relation Age of Onset   Thyroid  disease Mother    Hypertension Mother    Diabetes Mother    COPD Mother    Emphysema Mother    Hyperlipidemia Mother    Diabetes Father    Kidney disease Father    Hypertension Father    Thyroid  disease Brother     Outpatient Encounter Medications as  of 11/06/2023  Medication Sig   levonorgestrel (MIRENA) 20 MCG/DAY IUD by Intrauterine route.   levothyroxine  (SYNTHROID ) 112 MCG tablet Take 1 tablet (112 mcg total) by mouth daily before breakfast.   lisinopril  (ZESTRIL ) 20 MG tablet Take 1 tablet (20 mg total) by mouth daily.   meloxicam (MOBIC) 15 MG tablet Take 15 mg by mouth daily.   omeprazole (PRILOSEC) 10 MG capsule Take 10 mg by mouth as needed.   [DISCONTINUED] doxycycline (VIBRA-TABS) 100 MG tablet Take 100 mg by mouth 2 (two) times daily.   No facility-administered encounter medications on file as of 11/06/2023.    ALLERGIES: No Known Allergies VACCINATION STATUS:  There is no immunization history on file for this patient.   HPI   Heidi Phelps  is a patient with the above medical history. she was diagnosed with hypothyroidism at approximate age of 21 years, which required subsequent initiation of thyroid  hormone replacement. she was given various doses of Levothyroxine  over the years, currently on 125 micrograms. she reports compliance to this medication:  Taking it daily on empty stomach separate from other medications.  I reviewed patient's thyroid  tests:  Lab Results  Component Value Date   TSH 0.216 (L) 10/27/2023   TSH 1.220 07/23/2023   TSH 0.234 (L) 04/14/2023   TSH 0.043 (L) 01/08/2023   TSH 0.009 (L) 10/02/2022   TSH 3.890 07/02/2022   TSH 0.087  (L) 02/26/2022   TSH 0.015 (L) 10/18/2021   TSH 0.009 (L) 08/22/2021   TSH 0.021 (L) 07/18/2021   FREET4 1.49 10/27/2023   FREET4 1.48 07/23/2023   FREET4 1.40 04/14/2023   FREET4 1.79 (H) 01/08/2023   FREET4 1.97 (H) 10/02/2022   FREET4 1.41 07/02/2022   FREET4 1.57 02/26/2022   FREET4 1.77 10/18/2021   FREET4 1.59 08/22/2021   FREET4 2.00 (H) 07/18/2021    Pt denies feeling nodules in neck, hoarseness, dysphagia/odynophagia, SOB with lying down.  she does have extensive family history of thyroid  disorders in her mother, father, siblings and grandparents (hypothyroidism).  No family history of thyroid  cancer.  No history of radiation therapy to head or neck.  No recent use of iodine supplements.  Denies use of Biotin containing supplements.  I reviewed her chart and she also has a history of psoriasis.   Review of systems  Constitutional: + increasing body weight (admits to stress eating lately),  current Body mass index is 34.69 kg/m. , + profound fatigue no subjective hyperthermia, no subjective hypothermia Eyes: no blurry vision, no xerophthalmia ENT: no sore throat, no nodules palpated in throat, no dysphagia/odynophagia, no hoarseness Cardiovascular: no chest pain, no shortness of breath, no palpitations, no leg swelling Respiratory: no cough, no shortness of breath Gastrointestinal: no nausea/vomiting/diarrhea Musculoskeletal: no muscle/joint pain Skin: no rashes, no hyperemia, + hair loss Neurological: no tremors, no numbness, no tingling, no dizziness Psychiatric: no depression, no anxiety   Objective:   Objective     BP 108/80 (BP Location: Left Arm, Patient Position: Sitting, Cuff Size: Large)   Pulse 60   Ht 5' 1 (1.549 m)   Wt 183 lb 9.6 oz (83.3 kg)   BMI 34.69 kg/m  Wt Readings from Last 3 Encounters:  11/06/23 183 lb 9.6 oz (83.3 kg)  11/05/23 181 lb 6.4 oz (82.3 kg)  07/30/23 176 lb (79.8 kg)    BP Readings from Last 3 Encounters:  11/06/23  108/80  11/05/23 114/70  07/30/23 102/80       Physical Exam- Limited  Constitutional:  Body mass index is 34.69 kg/m. , not in acute distress, normal state of mind Eyes:  EOMI, no exophthalmos Musculoskeletal: no gross deformities, strength intact in all four extremities, no gross restriction of joint movements Skin:  no rashes, no hyperemia Neurological: no tremor with outstretched hands   CMP ( most recent) CMP     Component Value Date/Time   NA 143 10/27/2023 0828   K 5.1 10/27/2023 0828   CL 106 10/27/2023 0828   CO2 20 10/27/2023 0828   GLUCOSE 144 (H) 10/27/2023 0828   GLUCOSE 100 (H) 11/25/2022 1409   BUN 16 10/27/2023 0828   CREATININE 0.94 10/27/2023 0828   CALCIUM  9.6 10/27/2023 0828   PROT 7.0 10/27/2023 0828   ALBUMIN 4.6 10/27/2023 0828   AST 20 10/27/2023 0828   ALT 18 10/27/2023 0828   ALKPHOS 68 10/27/2023 0828   BILITOT 0.4 10/27/2023 0828   GFRNONAA 54 (L) 11/25/2022 1409     Diabetic Labs (most recent): Lab Results  Component Value Date   HGBA1C 5.7 (H) 10/27/2023   HGBA1C 5.2 11/25/2022     Lipid Panel ( most recent) Lipid Panel     Component Value Date/Time   TRIG 78 11/25/2022 1409   HDL 52 11/25/2022 1409       Lab Results  Component Value Date   TSH 0.216 (L) 10/27/2023   TSH 1.220 07/23/2023   TSH 0.234 (L) 04/14/2023   TSH 0.043 (L) 01/08/2023   TSH 0.009 (L) 10/02/2022   TSH 3.890 07/02/2022   TSH 0.087 (L) 02/26/2022   TSH 0.015 (L) 10/18/2021   TSH 0.009 (L) 08/22/2021   TSH 0.021 (L) 07/18/2021   FREET4 1.49 10/27/2023   FREET4 1.48 07/23/2023   FREET4 1.40 04/14/2023   FREET4 1.79 (H) 01/08/2023   FREET4 1.97 (H) 10/02/2022   FREET4 1.41 07/02/2022   FREET4 1.57 02/26/2022   FREET4 1.77 10/18/2021   FREET4 1.59 08/22/2021   FREET4 2.00 (H) 07/18/2021     Latest Reference Range & Units 10/18/21 15:45 02/26/22 16:01 07/02/22 08:27 10/02/22 09:30 01/08/23 08:21 04/14/23 08:25  TSH 0.450 - 4.500 uIU/mL 0.015  (L) 0.087 (L) 3.890 0.009 (L) 0.043 (L) 0.234 (L)  T4,Free(Direct) 0.82 - 1.77 ng/dL 8.22 8.42 8.58 8.02 (H) 1.79 (H) 1.40  (L): Data is abnormally low (H): Data is abnormally high   Latest Reference Range & Units 07/23/23 09:33  COMPREHENSIVE METABOLIC PANEL  Rpt !  Sodium 134 - 144 mmol/L 142  Potassium 3.5 - 5.2 mmol/L 5.1  Chloride 96 - 106 mmol/L 104  CO2 20 - 29 mmol/L 25  Glucose 70 - 99 mg/dL 883 (H)  BUN 6 - 24 mg/dL 15  Creatinine 9.42 - 8.99 mg/dL 9.10  Calcium  8.7 - 10.2 mg/dL 89.6 (H)  BUN/Creatinine Ratio 9 - 23  17  eGFR >59 mL/min/1.73 77  Alkaline Phosphatase 44 - 121 IU/L 73  Albumin 3.8 - 4.9 g/dL 4.4  AST 0 - 40 IU/L 19  ALT 0 - 32 IU/L 15  Total Protein 6.0 - 8.5 g/dL 7.0  Total Bilirubin 0.0 - 1.2 mg/dL 0.2  Iron 27 - 840 ug/dL 57  UIBC 868 - 574 ug/dL 706  TIBC 749 - 549 ug/dL 649  Ferritin 15 - 849 ng/mL 61  Iron Saturation 15 - 55 % 16  Folate >3.0 ng/mL 9.7  Vitamin D , 25-Hydroxy 30.0 - 100.0 ng/mL 31.1  Vitamin B12 232 - 1,245 pg/mL 394  Globulin, Total 1.5 -  4.5 g/dL 2.6  TSH 9.549 - 5.499 uIU/mL 1.220  T4,Free(Direct) 0.82 - 1.77 ng/dL 8.51  !: Data is abnormal (H): Data is abnormally high Rpt: View report in Results Review for more information   Latest Reference Range & Units 10/02/22 09:30 01/08/23 08:21 04/14/23 08:25 07/23/23 09:33 10/27/23 08:28  TSH 0.450 - 4.500 uIU/mL 0.009 (L) 0.043 (L) 0.234 (L) 1.220 0.216 (L)  T4,Free(Direct) 0.82 - 1.77 ng/dL 8.02 (H) 8.20 (H) 8.59 1.48 1.49  (L): Data is abnormally low (H): Data is abnormally high  Assessment & Plan:   ASSESSMENT / PLAN:  1. Hypothyroidism-acquired  Patient with long-standing hypothyroidism, on levothyroxine  therapy. On physical exam, patient does not have gross goiter, thyroid  nodules, or neck compression symptoms.    Her repeat thyroid  function tests are consistent with appropriate hormone replacement (TSH slightly low but FT4 is normal and she denies symptoms of  over-replacement).  She is advised to continue Levothyroxine  112 mcg po daily before breakfast.    - We discussed about correct intake of levothyroxine , at fasting, with water, separated by at least 30 minutes from breakfast, and separated by more than 4 hours from calcium , iron, multivitamins, acid reflux medications (PPIs). -Patient is made aware of the fact that thyroid  hormone replacement is needed for life, dose to be adjusted by periodic monitoring of thyroid  function tests.  2. Fatigue She notes she tends to have good and bad days, more recently has seen more days where is is really fatigued.  She notes she recently started using her CPap machine but cannot tolerate the mask, she is following up to see what her other options are.  Will check vitamin d , b12, ferritin, iron panel to help identify etiology of her fatigue since it does not appear to be related to her thyroid .  Will continue workup for parathyroid gland to make sure it is not the culprit.  3. Hypercalcemia This is the first incidence of hypercalcemia, mildly elevated at 10.3.  She does take a MVI.  She is not on hydrochlorothiazide .  No personal or family history of parathyroid problems, no osteoporosis, no kidney stones.  She does have history of low vitamin D , currently on replacement therapy.  24 hr urine rules out FHH.  PTH once again elevated.  Suspect early hyperparathyroidism.  Will order NM Parathyroid scan to help identify which gland is the cause.  She is aware that treatment is surgical removal if needed.  Will call patient with results and next steps.    I spent  30  minutes in the care of the patient today including review of labs from Thyroid  Function, CMP, and other relevant labs ; imaging/biopsy records (current and previous including abstractions from other facilities); face-to-face time discussing  her lab results and symptoms, medications doses, her options of short and long term treatment based on the latest  standards of care / guidelines;   and documenting the encounter.  Clotilda LELON Finder  participated in the discussions, expressed understanding, and voiced agreement with the above plans.  All questions were answered to her satisfaction. she is encouraged to contact clinic should she have any questions or concerns prior to her return visit.   FOLLOW UP PLAN:  Return will call with parathyroid scan results.  Benton Rio, [facility] [address] [place], [place] [address] Phone: [phone] Fax: [phone]  11/06/2023, 4:18 PM

## 2023-11-13 ENCOUNTER — Encounter (HOSPITAL_COMMUNITY)
Admission: RE | Admit: 2023-11-13 | Discharge: 2023-11-13 | Disposition: A | Discharge status: Home / Self Care | Source: Ambulatory Visit | Attending: Nurse Practitioner | Admitting: Nurse Practitioner

## 2023-11-13 DIAGNOSIS — R5383 Other fatigue: Secondary | ICD-10-CM | POA: Diagnosis not present

## 2023-11-13 DIAGNOSIS — E213 Hyperparathyroidism, unspecified: Secondary | ICD-10-CM | POA: Diagnosis present

## 2023-11-13 MED ORDER — TECHNETIUM TC 99M SESTAMIBI - CARDIOLITE
25.0000 | Freq: Once | INTRAVENOUS | Status: AC | PRN
  Administered 2023-11-13: 25.7 via INTRAVENOUS

## 2023-11-17 ENCOUNTER — Ambulatory Visit: Payer: Self-pay | Admitting: Nurse Practitioner

## 2023-11-17 DIAGNOSIS — E039 Hypothyroidism, unspecified: Secondary | ICD-10-CM

## 2023-11-17 NOTE — Progress Notes (Signed)
 Can you take a look at her case for me?  Her calcium  was normal at last check but previously only marginally elevated.  Her PTH was elevated at last check but normal the time before. Her Feca score was 0.009 dating back to 2024- indicating possible FHH but I wanted you to take a look and make sure that is right.  I have never had one come back positive before.

## 2023-11-18 NOTE — Telephone Encounter (Signed)
 See patient response.

## 2023-11-18 NOTE — Telephone Encounter (Signed)
 Needs follow up in 4 months with previsit labs.

## 2023-11-18 NOTE — Progress Notes (Signed)
 FYI I sent mychart message going over parathyroid  scan results.

## 2024-01-30 ENCOUNTER — Other Ambulatory Visit: Payer: Self-pay | Admitting: Medical Genetics

## 2024-02-02 ENCOUNTER — Other Ambulatory Visit (HOSPITAL_COMMUNITY)
Admission: RE | Admit: 2024-02-02 | Discharge: 2024-02-02 | Disposition: A | Discharge status: Home / Self Care | Source: Ambulatory Visit | Attending: Medical Genetics | Admitting: Medical Genetics

## 2024-02-10 LAB — GENECONNECT MOLECULAR SCREEN: Genetic Analysis Overall Interpretation: NEGATIVE

## 2024-02-13 ENCOUNTER — Other Ambulatory Visit: Payer: Self-pay | Admitting: Internal Medicine

## 2024-03-18 LAB — TSH: TSH: 1.92 u[IU]/mL (ref 0.450–4.500)

## 2024-03-18 LAB — T4, FREE: Free T4: 1.11 ng/dL (ref 0.82–1.77)

## 2024-03-22 ENCOUNTER — Ambulatory Visit (INDEPENDENT_AMBULATORY_CARE_PROVIDER_SITE_OTHER): Admitting: Nurse Practitioner

## 2024-03-22 ENCOUNTER — Encounter: Payer: Self-pay | Admitting: Nurse Practitioner

## 2024-03-22 VITALS — BP 132/78 | HR 84 | Ht 61.0 in | Wt 192.6 lb

## 2024-03-22 DIAGNOSIS — E039 Hypothyroidism, unspecified: Secondary | ICD-10-CM | POA: Diagnosis not present

## 2024-03-22 MED ORDER — LEVOTHYROXINE SODIUM 125 MCG PO TABS
125.0000 ug | ORAL_TABLET | Freq: Every day | ORAL | 1 refills | Status: AC
Start: 2024-03-22 — End: ?

## 2024-03-22 NOTE — Progress Notes (Signed)
 Endocrinology Follow Up Note                                         03/22/2024, 3:51 PM  Subjective:   Subjective    Heidi Phelps is a 53 y.o.-year-old female patient being seen in follow up after being seen in consultation for hypothyroidism referred by Vida Mardy DEL, PA-C.   Past Medical History:  Diagnosis Date   Chest pain    Hyperlipidemia    Hypertension    Hypothyroidism    Neck strain    OSA on CPAP    moderate OSA with an AHI of 15.2/hr and was started on auto CPAP at 4-15cm H2O    Past Surgical History:  Procedure Laterality Date   BACK SURGERY     Gallbladder      Social History   Socioeconomic History   Marital status: Married    Spouse name: Not on file   Number of children: Not on file   Years of education: Not on file   Highest education level: Not on file  Occupational History   Not on file  Tobacco Use   Smoking status: Never   Smokeless tobacco: Never  Vaping Use   Vaping status: Never Used  Substance and Sexual Activity   Alcohol use: Never   Drug use: Never   Sexual activity: Not on file  Other Topics Concern   Not on file  Social History Narrative   Not on file   Social Drivers of Health   Financial Resource Strain: Not on file  Food Insecurity: No Food Insecurity (07/05/2022)   Received from [facility]   Hunger Vital Sign    Within the past 12 months, you worried that your food would run out before you got the money to buy more.: Never true    Within the past 12 months, the food you bought just didn't last and you didn't have money to get more.: Never true  Transportation Needs: Not on file  Physical Activity: Inactive (05/24/2021)   Received from [facility]   Exercise Vital Sign    On average, how many days per week do you engage in moderate to strenuous exercise (like a brisk walk)?: 0 days    On average, how many minutes do you engage in exercise  at this level?: 0 min  Stress: Stress Concern Present (05/24/2021)   Received from [facility] - Occupational Stress Questionnaire    Feeling of Stress : Rather much  Social Connections: Not on file    Family History  Problem Relation Age of Onset   Thyroid  disease Mother    Hypertension Mother    Diabetes Mother    COPD Mother    Emphysema Mother    Hyperlipidemia Mother    Diabetes Father    Kidney disease Father    Hypertension Father    Thyroid  disease Brother     Outpatient Encounter Medications as  of 03/22/2024  Medication Sig   levonorgestrel (MIRENA) 20 MCG/DAY IUD by Intrauterine route.   lisinopril  (ZESTRIL ) 20 MG tablet TAKE ONE TABLET BY MOUTH EVERY DAY   meloxicam (MOBIC) 15 MG tablet Take 15 mg by mouth daily.   omeprazole (PRILOSEC) 10 MG capsule Take 10 mg by mouth as needed.   [DISCONTINUED] levothyroxine  (SYNTHROID ) 112 MCG tablet Take 1 tablet (112 mcg total) by mouth daily before breakfast.   levothyroxine  (SYNTHROID ) 125 MCG tablet Take 1 tablet (125 mcg total) by mouth daily before breakfast.   No facility-administered encounter medications on file as of 03/22/2024.    ALLERGIES: No Known Allergies VACCINATION STATUS:  There is no immunization history on file for this patient.   HPI   Heidi Phelps  is a patient with the above medical history. she was diagnosed with hypothyroidism at approximate age of 21 years, which required subsequent initiation of thyroid  hormone replacement. she was given various doses of Levothyroxine  over the years, currently on 125 micrograms. she reports compliance to this medication:  Taking it daily on empty stomach separate from other medications.  I reviewed patient's thyroid  tests:  Lab Results  Component Value Date   TSH 1.920 03/17/2024   TSH 0.216 (L) 10/27/2023   TSH 1.220 07/23/2023   TSH 0.234 (L) 04/14/2023   TSH 0.043 (L) 01/08/2023   TSH 0.009 (L)  10/02/2022   TSH 3.890 07/02/2022   TSH 0.087 (L) 02/26/2022   TSH 0.015 (L) 10/18/2021   TSH 0.009 (L) 08/22/2021   FREET4 1.11 03/17/2024   FREET4 1.49 10/27/2023   FREET4 1.48 07/23/2023   FREET4 1.40 04/14/2023   FREET4 1.79 (H) 01/08/2023   FREET4 1.97 (H) 10/02/2022   FREET4 1.41 07/02/2022   FREET4 1.57 02/26/2022   FREET4 1.77 10/18/2021   FREET4 1.59 08/22/2021    Pt denies feeling nodules in neck, hoarseness, dysphagia/odynophagia, SOB with lying down.  she does have extensive family history of thyroid  disorders in her mother, father, siblings and grandparents (hypothyroidism).  No family history of thyroid  cancer.  No history of radiation therapy to head or neck.  No recent use of iodine supplements.  Denies use of Biotin containing supplements.  I reviewed her chart and she also has a history of psoriasis.   Review of systems  Constitutional: + increasing body weight,  current Body mass index is 36.39 kg/m. , + profound fatigue no subjective hyperthermia, no subjective hypothermia Eyes: no blurry vision, no xerophthalmia ENT: no sore throat, no nodules palpated in throat, no dysphagia/odynophagia, no hoarseness Cardiovascular: no chest pain, no shortness of breath, no palpitations, no leg swelling Respiratory: no cough, no shortness of breath Gastrointestinal: no nausea/vomiting/diarrhea Musculoskeletal: no muscle/joint pain Skin: no rashes, no hyperemia, + hair loss Neurological: no tremors, no numbness, no tingling, no dizziness Psychiatric: no depression, no anxiety   Objective:   Objective     BP 132/78 (BP Location: Left Arm, Patient Position: Sitting, Cuff Size: Large)   Pulse 84   Ht 5' 1 (1.549 m)   Wt 192 lb 9.6 oz (87.4 kg)   BMI 36.39 kg/m  Wt Readings from Last 3 Encounters:  03/22/24 192 lb 9.6 oz (87.4 kg)  11/06/23 183 lb 9.6 oz (83.3 kg)  11/05/23 181 lb 6.4 oz (82.3 kg)    BP Readings from Last 3 Encounters:  03/22/24 132/78   11/06/23 108/80  11/05/23 114/70      Physical Exam- Limited  Constitutional:  Body mass index is  36.39 kg/m. , not in acute distress, normal state of mind Eyes:  EOMI, no exophthalmos Musculoskeletal: no gross deformities, strength intact in all four extremities, no gross restriction of joint movements Skin:  no rashes, no hyperemia Neurological: no tremor with outstretched hands   CMP ( most recent) CMP     Component Value Date/Time   NA 143 10/27/2023 0828   K 5.1 10/27/2023 0828   CL 106 10/27/2023 0828   CO2 20 10/27/2023 0828   GLUCOSE 144 (H) 10/27/2023 0828   GLUCOSE 100 (H) 11/25/2022 1409   BUN 16 10/27/2023 0828   CREATININE 0.94 10/27/2023 0828   CALCIUM  9.6 10/27/2023 0828   PROT 7.0 10/27/2023 0828   ALBUMIN 4.6 10/27/2023 0828   AST 20 10/27/2023 0828   ALT 18 10/27/2023 0828   ALKPHOS 68 10/27/2023 0828   BILITOT 0.4 10/27/2023 0828   GFRNONAA 54 (L) 11/25/2022 1409     Diabetic Labs (most recent): Lab Results  Component Value Date   HGBA1C 5.7 (H) 10/27/2023   HGBA1C 5.2 11/25/2022     Lipid Panel ( most recent) Lipid Panel     Component Value Date/Time   TRIG 78 11/25/2022 1409   HDL 52 11/25/2022 1409       Lab Results  Component Value Date   TSH 1.920 03/17/2024   TSH 0.216 (L) 10/27/2023   TSH 1.220 07/23/2023   TSH 0.234 (L) 04/14/2023   TSH 0.043 (L) 01/08/2023   TSH 0.009 (L) 10/02/2022   TSH 3.890 07/02/2022   TSH 0.087 (L) 02/26/2022   TSH 0.015 (L) 10/18/2021   TSH 0.009 (L) 08/22/2021   FREET4 1.11 03/17/2024   FREET4 1.49 10/27/2023   FREET4 1.48 07/23/2023   FREET4 1.40 04/14/2023   FREET4 1.79 (H) 01/08/2023   FREET4 1.97 (H) 10/02/2022   FREET4 1.41 07/02/2022   FREET4 1.57 02/26/2022   FREET4 1.77 10/18/2021   FREET4 1.59 08/22/2021     Latest Reference Range & Units 10/18/21 15:45 02/26/22 16:01 07/02/22 08:27 10/02/22 09:30 01/08/23 08:21 04/14/23 08:25  TSH 0.450 - 4.500 uIU/mL 0.015 (L) 0.087 (L)  3.890 0.009 (L) 0.043 (L) 0.234 (L)  T4,Free(Direct) 0.82 - 1.77 ng/dL 8.22 8.42 8.58 8.02 (H) 1.79 (H) 1.40  (L): Data is abnormally low (H): Data is abnormally high   Latest Reference Range & Units 07/23/23 09:33  COMPREHENSIVE METABOLIC PANEL  Rpt !  Sodium 134 - 144 mmol/L 142  Potassium 3.5 - 5.2 mmol/L 5.1  Chloride 96 - 106 mmol/L 104  CO2 20 - 29 mmol/L 25  Glucose 70 - 99 mg/dL 883 (H)  BUN 6 - 24 mg/dL 15  Creatinine 9.42 - 8.99 mg/dL 9.10  Calcium  8.7 - 10.2 mg/dL 89.6 (H)  BUN/Creatinine Ratio 9 - 23  17  eGFR >59 mL/min/1.73 77  Alkaline Phosphatase 44 - 121 IU/L 73  Albumin 3.8 - 4.9 g/dL 4.4  AST 0 - 40 IU/L 19  ALT 0 - 32 IU/L 15  Total Protein 6.0 - 8.5 g/dL 7.0  Total Bilirubin 0.0 - 1.2 mg/dL 0.2  Iron 27 - 840 ug/dL 57  UIBC 868 - 574 ug/dL 706  TIBC 749 - 549 ug/dL 649  Ferritin 15 - 849 ng/mL 61  Iron Saturation 15 - 55 % 16  Folate >3.0 ng/mL 9.7  Vitamin D , 25-Hydroxy 30.0 - 100.0 ng/mL 31.1  Vitamin B12 232 - 1,245 pg/mL 394  Globulin, Total 1.5 - 4.5 g/dL 2.6  TSH 9.549 - 5.499  uIU/mL 1.220  T4,Free(Direct) 0.82 - 1.77 ng/dL 8.51  !: Data is abnormal (H): Data is abnormally high Rpt: View report in Results Review for more information   Latest Reference Range & Units 04/14/23 08:25 07/23/23 09:33 10/27/23 08:28 03/17/24 08:56  TSH 0.450 - 4.500 uIU/mL 0.234 (L) 1.220 0.216 (L) 1.920  T4,Free(Direct) 0.82 - 1.77 ng/dL 8.59 8.51 8.50 8.88  (L): Data is abnormally low  Assessment & Plan:   ASSESSMENT / PLAN:  1. Hypothyroidism-acquired  Patient with long-standing hypothyroidism, on levothyroxine  therapy. On physical exam, patient does not have gross goiter, thyroid  nodules, or neck compression symptoms.    Her repeat thyroid  function tests are consistent with appropriate hormone replacement but she still is symptomatic.  She is advised to increase Levothyroxine  to 125 mcg po daily before breakfast.   Will recheck TFTs prior to next visit and  potentially add Cytomel as supportive agent, if needed.   - We discussed about correct intake of levothyroxine , at fasting, with water, separated by at least 30 minutes from breakfast, and separated by more than 4 hours from calcium , iron, multivitamins, acid reflux medications (PPIs). -Patient is made aware of the fact that thyroid  hormone replacement is needed for life, dose to be adjusted by periodic monitoring of thyroid  function tests.  2. Fatigue She notes she tends to have good and bad days, more recently has seen more days where is is really fatigued.  She notes she recently started using her CPap machine but cannot tolerate the mask, she is following up to see what her other options are.  Will check vitamin d , b12, ferritin, iron panel to help identify etiology of her fatigue since it does not appear to be related to her thyroid .  All these labs came back normal.  3. Hypercalcemia This is the first incidence of hypercalcemia, mildly elevated at 10.3.  She does take a MVI.  She is not on hydrochlorothiazide .  No personal or family history of parathyroid  problems, no osteoporosis, no kidney stones.  She does have history of low vitamin D , currently on replacement therapy.  24 hr urine rules out FHH.  PTH once again elevated.  Suspect early hyperparathyroidism.  Will order NM Parathyroid  scan to help identify which gland is the cause.  She is aware that treatment is surgical removal if needed.  Will call patient with results and next steps.  NM parathyroid  scan shows no parathyroid  adenoma.  In any case, she will not need treatment for her hypercalcemia unless calcium  reaches 11.3 or higher.  Will recheck CMP on subsequent visits for surveillance.   I spent  41  minutes in the care of the patient today including review of labs from Thyroid  Function, CMP, and other relevant labs ; imaging/biopsy records (current and previous including abstractions from other facilities); face-to-face time  discussing  her lab results and symptoms, medications doses, her options of short and long term treatment based on the latest standards of care / guidelines;   and documenting the encounter.  Clotilda LELON Finder  participated in the discussions, expressed understanding, and voiced agreement with the above plans.  All questions were answered to her satisfaction. she is encouraged to contact clinic should she have any questions or concerns prior to her return visit.   FOLLOW UP PLAN:  Return in about 4 months (around 07/20/2024) for Thyroid  follow up, Previsit labs.  Benton Rio, [facility] [address] [place], [place] [address] Phone: [phone] Fax: [phone]  03/22/2024, 3:51  PM

## 2024-03-22 NOTE — Patient Instructions (Signed)

## 2024-04-12 ENCOUNTER — Encounter: Payer: Self-pay | Admitting: Nurse Practitioner

## 2024-04-16 ENCOUNTER — Encounter: Payer: Self-pay | Admitting: Cardiology

## 2024-07-22 ENCOUNTER — Ambulatory Visit: Admitting: Nurse Practitioner
